# Patient Record
Sex: Female | Born: 1995
Health system: Southern US, Community
[De-identification: ages and names within clinical notes are randomized; demographics above are authoritative.]

## PROBLEM LIST (undated history)

## (undated) DIAGNOSIS — L309 Dermatitis, unspecified: Secondary | ICD-10-CM

## (undated) DIAGNOSIS — L509 Urticaria, unspecified: Secondary | ICD-10-CM

## (undated) HISTORY — DX: Dermatitis, unspecified: L30.9

## (undated) HISTORY — DX: Urticaria, unspecified: L50.9

---

## 2019-02-14 ENCOUNTER — Emergency Department (HOSPITAL_COMMUNITY): Payer: Self-pay

## 2019-02-14 ENCOUNTER — Encounter (HOSPITAL_COMMUNITY): Payer: Self-pay | Admitting: Emergency Medicine

## 2019-02-14 ENCOUNTER — Other Ambulatory Visit: Payer: Self-pay

## 2019-02-14 ENCOUNTER — Emergency Department (HOSPITAL_COMMUNITY)
Admission: EM | Admit: 2019-02-14 | Discharge: 2019-02-15 | Disposition: A | Discharge status: Home / Self Care | Payer: Self-pay | Attending: Emergency Medicine | Admitting: Emergency Medicine

## 2019-02-14 DIAGNOSIS — S0990XA Unspecified injury of head, initial encounter: Secondary | ICD-10-CM | POA: Insufficient documentation

## 2019-02-14 DIAGNOSIS — M5412 Radiculopathy, cervical region: Secondary | ICD-10-CM | POA: Insufficient documentation

## 2019-02-14 DIAGNOSIS — X58XXXA Exposure to other specified factors, initial encounter: Secondary | ICD-10-CM | POA: Insufficient documentation

## 2019-02-14 DIAGNOSIS — S161XXA Strain of muscle, fascia and tendon at neck level, initial encounter: Secondary | ICD-10-CM | POA: Insufficient documentation

## 2019-02-14 DIAGNOSIS — R109 Unspecified abdominal pain: Secondary | ICD-10-CM

## 2019-02-14 DIAGNOSIS — Y9319 Activity, other involving water and watercraft: Secondary | ICD-10-CM | POA: Insufficient documentation

## 2019-02-14 DIAGNOSIS — Y999 Unspecified external cause status: Secondary | ICD-10-CM | POA: Insufficient documentation

## 2019-02-14 DIAGNOSIS — R1011 Right upper quadrant pain: Secondary | ICD-10-CM | POA: Insufficient documentation

## 2019-02-14 DIAGNOSIS — Y929 Unspecified place or not applicable: Secondary | ICD-10-CM | POA: Insufficient documentation

## 2019-02-14 DIAGNOSIS — M25511 Pain in right shoulder: Secondary | ICD-10-CM | POA: Insufficient documentation

## 2019-02-14 LAB — COMPREHENSIVE METABOLIC PANEL
ALT: 28 U/L (ref 0–44)
AST: 25 U/L (ref 15–41)
Albumin: 4.1 g/dL (ref 3.5–5.0)
Alkaline Phosphatase: 61 U/L (ref 38–126)
Anion gap: 12 (ref 5–15)
BUN: 9 mg/dL (ref 6–20)
CO2: 22 mmol/L (ref 22–32)
Calcium: 9.2 mg/dL (ref 8.9–10.3)
Chloride: 106 mmol/L (ref 98–111)
Creatinine, Ser: 0.85 mg/dL (ref 0.44–1.00)
GFR calc Af Amer: >60 mL/min (ref >60)
GFR calc non Af Amer: >60 mL/min (ref >60)
Glucose, Bld: 90 mg/dL (ref 70–99)
Potassium: 3.6 mmol/L (ref 3.5–5.1)
Sodium: 140 mmol/L (ref 135–145)
Total Bilirubin: 0.6 mg/dL (ref 0.3–1.2)
Total Protein: 7 g/dL (ref 6.5–8.1)

## 2019-02-14 LAB — CBC WITH DIFFERENTIAL/PLATELET
Abs Immature Granulocytes: 0.05 10*3/uL (ref 0.00–0.07)
Basophils Absolute: 0.1 10*3/uL (ref 0.0–0.1)
Basophils Relative: 1 %
Eosinophils Absolute: 0.1 10*3/uL (ref 0.0–0.5)
Eosinophils Relative: 1 %
HCT: 42.4 % (ref 36.0–46.0)
Hemoglobin: 12.9 g/dL (ref 12.0–15.0)
Immature Granulocytes: 0 %
Lymphocytes Relative: 25 %
Lymphs Abs: 3.7 10*3/uL (ref 0.7–4.0)
MCH: 25.1 pg — ABNORMAL LOW (ref 26.0–34.0)
MCHC: 30.4 g/dL (ref 30.0–36.0)
MCV: 82.7 fL (ref 80.0–100.0)
Monocytes Absolute: 0.8 10*3/uL (ref 0.1–1.0)
Monocytes Relative: 5 %
Neutro Abs: 10.3 10*3/uL — ABNORMAL HIGH (ref 1.7–7.7)
Neutrophils Relative %: 68 %
Platelets: 459 10*3/uL — ABNORMAL HIGH (ref 150–400)
RBC: 5.13 MIL/uL — ABNORMAL HIGH (ref 3.87–5.11)
RDW: 13.6 % (ref 11.5–15.5)
WBC: 15.1 10*3/uL — ABNORMAL HIGH (ref 4.0–10.5)
nRBC: 0 % (ref 0.0–0.2)

## 2019-02-14 LAB — I-STAT BETA HCG BLOOD, ED (MC, WL, AP ONLY): I-stat hCG, quantitative: <5 m[IU]/mL (ref <5)

## 2019-02-14 LAB — ETHANOL: Alcohol, Ethyl (B): <10 mg/dL (ref <10)

## 2019-02-14 MED ORDER — FENTANYL CITRATE (PF) 100 MCG/2ML IJ SOLN
50.0000 ug | INTRAMUSCULAR | Status: DC | PRN
  Administered 2019-02-14: 23:00:00 50 ug via INTRAVENOUS
  Filled 2019-02-14 (×2): qty 2

## 2019-02-14 MED ORDER — SODIUM CHLORIDE 0.9 % IV BOLUS
500.0000 mL | Freq: Once | INTRAVENOUS | Status: AC
  Administered 2019-02-14: 21:00:00 500 mL via INTRAVENOUS

## 2019-02-14 MED ORDER — IOHEXOL 300 MG/ML  SOLN
100.0000 mL | Freq: Once | INTRAMUSCULAR | Status: AC | PRN
  Administered 2019-02-14: 100 mL via INTRAVENOUS
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
## 2019-02-14 NOTE — ED Notes (Signed)
Please call mom [phone]
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
## 2019-02-15 ENCOUNTER — Emergency Department (HOSPITAL_COMMUNITY): Payer: Self-pay

## 2019-02-15 NOTE — Discharge Instructions (Signed)
You may alternate Tylenol 1000 mg every 6 hours as needed for pain and Ibuprofen 800 mg every 8 hours as needed for pain.  Please take Ibuprofen with food.  Your imaging today was normal.  Steps to find a Primary Care Provider (PCP):  Call [phone] or [phone] to access "[facility]."  2.  You may also go on the [facility] website at [email or web address]  3.  [facility] also frequently accepts new patients.  [place] [phone]  4.  There are also multiple [facility], [facility] and [facility] practices throughout the [place] that are frequently accepting new patients. You may find a clinic that is close to your home and contact them.  [facility] [email or web address] [phone]  [facility] [email or web address]  [email or web address] [place] [email or web address] [phone]  5.  Local Health Departments also can provide primary care services.  [facility]  [place] [address] [phone]  [facility] [place] [address] [place] [facility] [place] [phone]

## 2019-02-15 NOTE — ED Notes (Signed)
Patient transported to MRI 

## 2020-02-16 ENCOUNTER — Other Ambulatory Visit: Payer: Self-pay

## 2020-02-16 ENCOUNTER — Emergency Department (INDEPENDENT_AMBULATORY_CARE_PROVIDER_SITE_OTHER): Payer: No Typology Code available for payment source

## 2020-02-16 ENCOUNTER — Emergency Department
Admission: EM | Admit: 2020-02-16 | Discharge: 2020-02-16 | Disposition: A | Discharge status: Home / Self Care | Payer: Self-pay | Source: Home / Self Care

## 2020-02-16 DIAGNOSIS — W1843XA Slipping, tripping and stumbling without falling due to stepping from one level to another, initial encounter: Secondary | ICD-10-CM | POA: Diagnosis not present

## 2020-02-16 DIAGNOSIS — S93402A Sprain of unspecified ligament of left ankle, initial encounter: Secondary | ICD-10-CM

## 2020-02-16 NOTE — Discharge Instructions (Signed)
  You may take 500mg acetaminophen every 4-6 hours or in combination with ibuprofen 400-600mg every 6-8 hours as needed for pain and inflammation.   Call to schedule a follow up appointment with Sports Medicine in 1-2 weeks if not improving.  
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
## 2020-07-09 ENCOUNTER — Other Ambulatory Visit: Payer: Self-pay

## 2020-07-09 ENCOUNTER — Ambulatory Visit (INDEPENDENT_AMBULATORY_CARE_PROVIDER_SITE_OTHER): Payer: 59

## 2020-07-09 DIAGNOSIS — Z23 Encounter for immunization: Secondary | ICD-10-CM

## 2020-07-29 DIAGNOSIS — F411 Generalized anxiety disorder: Secondary | ICD-10-CM | POA: Diagnosis not present

## 2020-07-31 IMAGING — MR MRI THORACIC SPINE WITHOUT CONTRAST
5 of 6 series · 24 of 48 positions shown · non-contrast
Comparison: None.

CLINICAL DATA: Trauma with right arm numbness and weakness.

EXAM:
MRI THORACIC SPINE WITHOUT CONTRAST
TECHNIQUE: Multiplanar, multisequence MR imaging of the thoracic spine was
performed. No intravenous contrast was administered.

[Series 14: T1 · sagittal · 6.0mm · 1.23mm/px · 2 of 9 slices shown (1 of 2)]
[im 1/9]
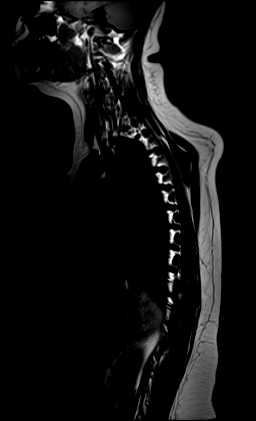
[im 9/9]
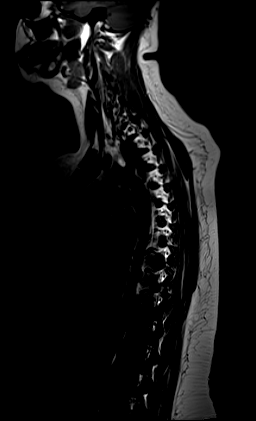

[Series 15: T2 · sagittal · 3.0mm · 0.76mm/px · 6 of 17 slices shown (1 of 2)]
[im 1/17]
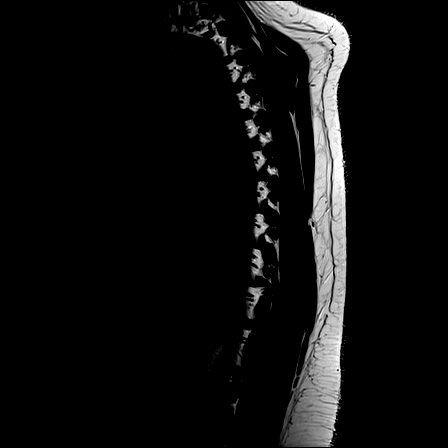
[im 4/17]
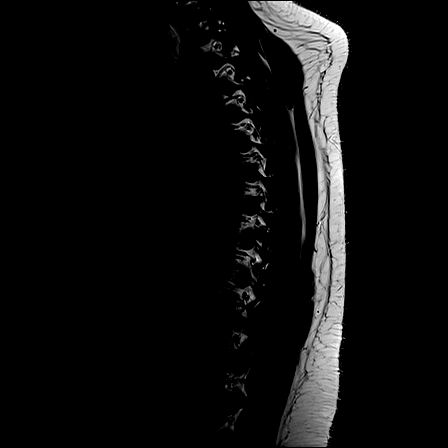
[im 7/17]
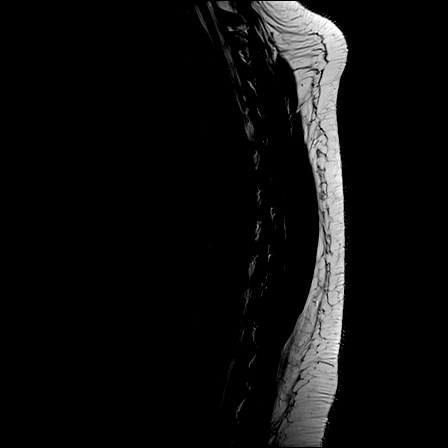
[im 10/17]
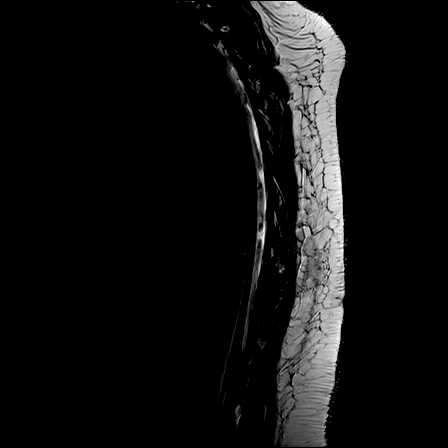
[im 13/17]
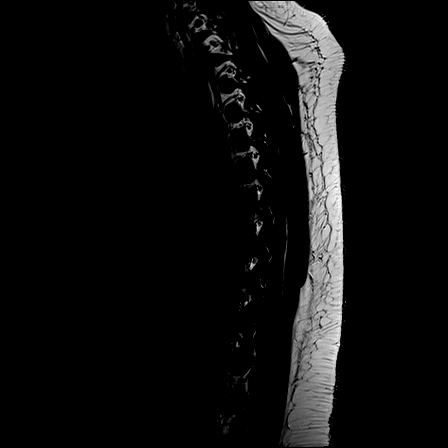
[im 17/17]
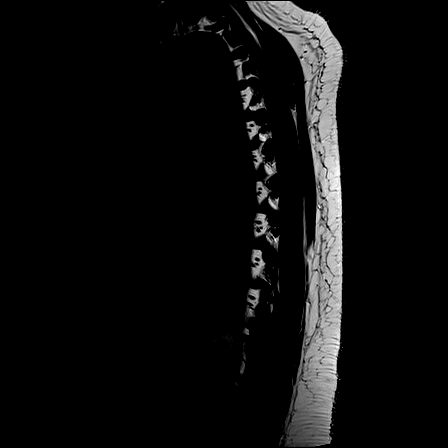

[Series 16: T1 · sagittal · 3.0mm · 0.76mm/px · 6 of 17 slices shown (2 of 2)]
[im 1/17]
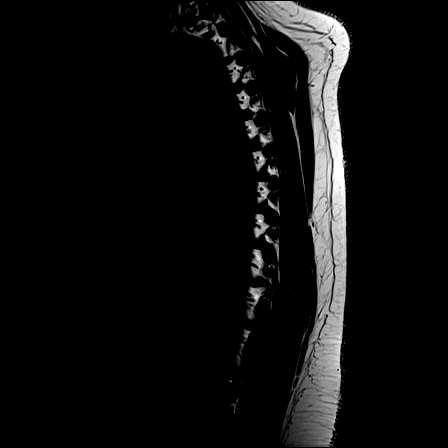
[im 4/17]
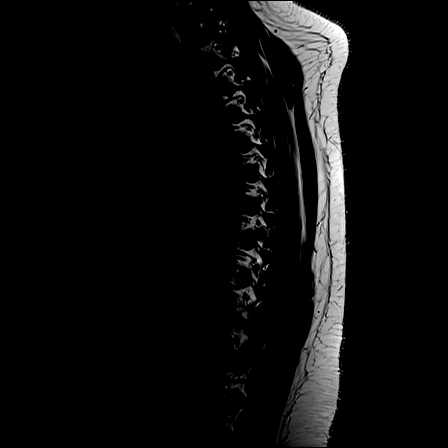
[im 7/17]
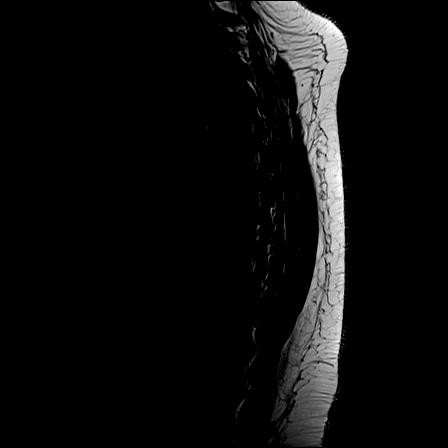
[im 10/17]
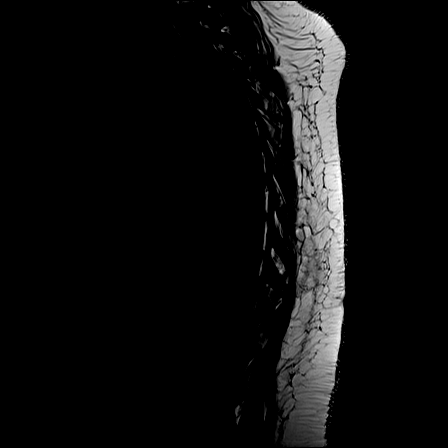
[im 13/17]
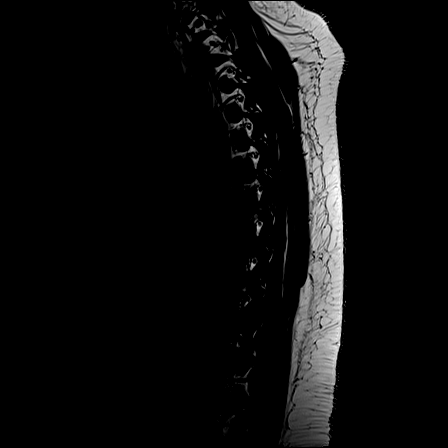
[im 17/17]
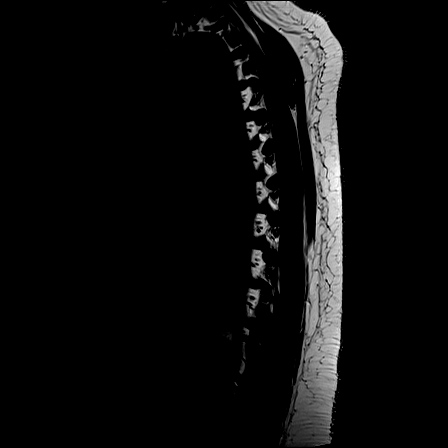

[Series 17: STIR · sagittal · 3.0mm · 0.38mm/px · 2 of 17 slices shown]
[im 1/17]
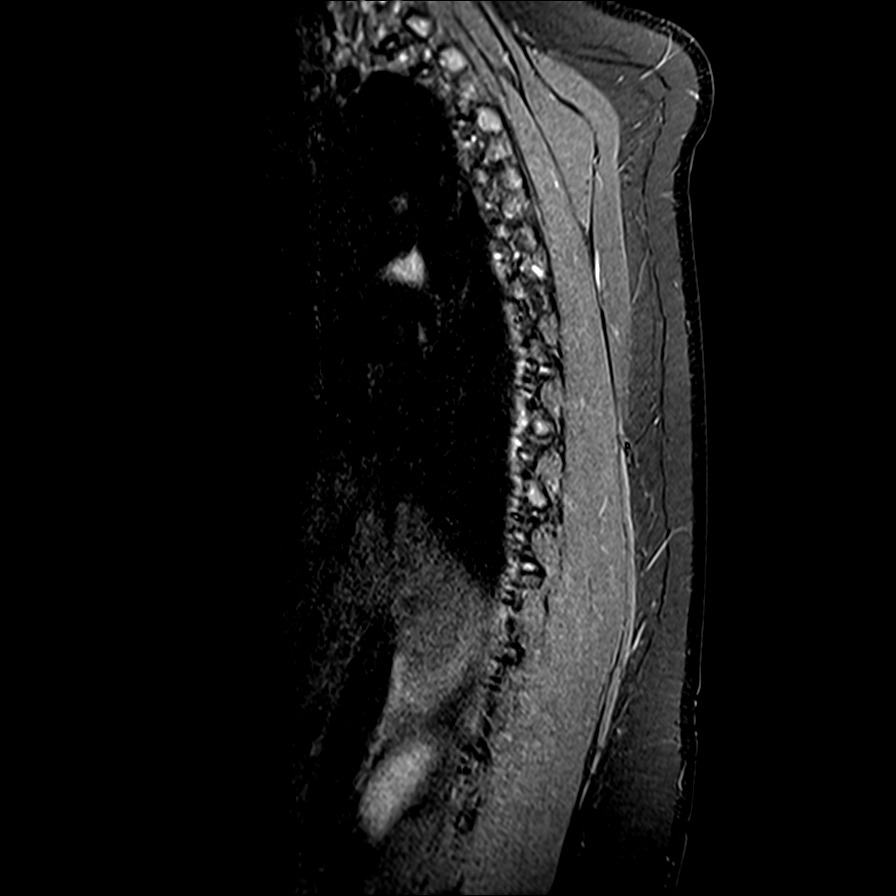
[im 4/17]
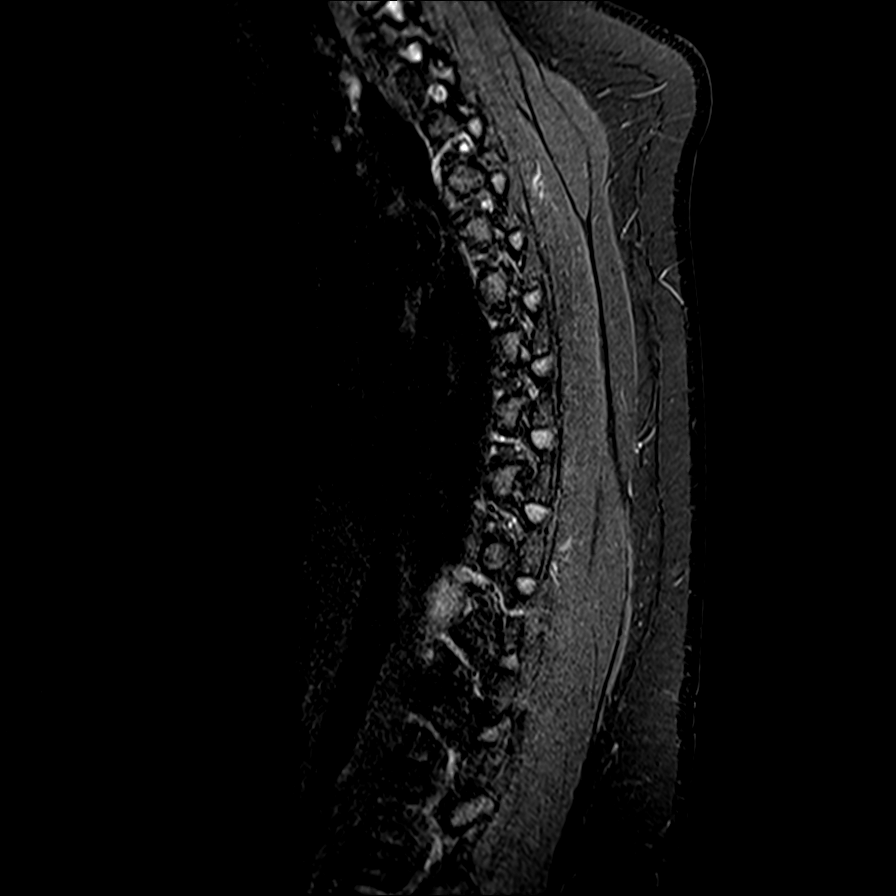

[Series 18: T2 · axial · 4.0mm · 0.59mm/px · z∈[-329,-89]mm · 8 of 39 slices shown (2 of 2)]
[im 1/39]
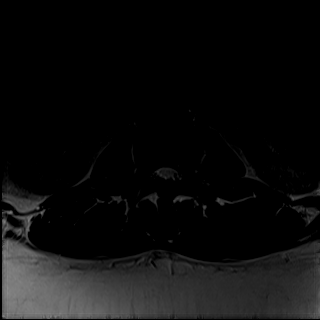
[im 6/39]
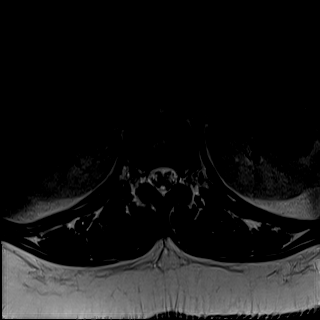
[im 12/39]
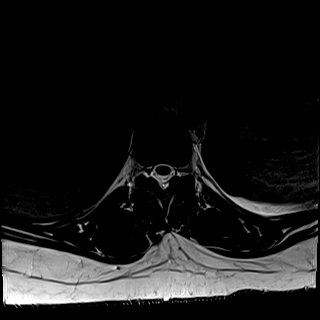
[im 18/39]
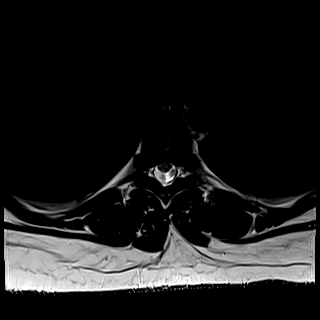
[im 21/39]
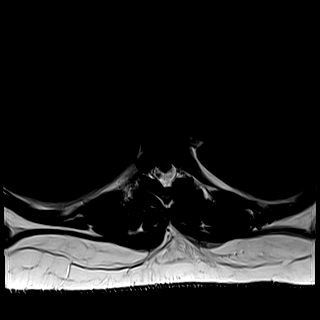
[im 27/39]
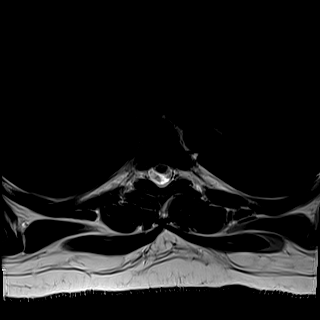
[im 33/39]
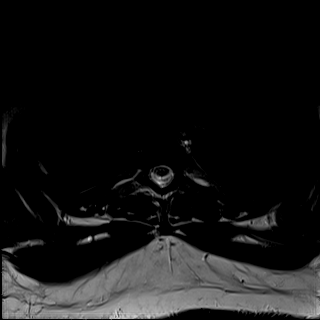
[im 39/39]
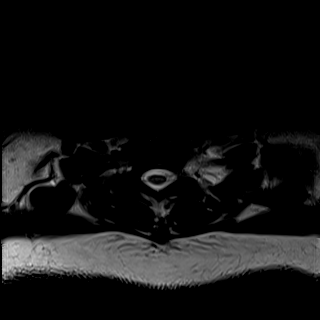

[24 of 48 positions shown; findings below may reference images not displayed]

FINDINGS: Alignment:  Normal

Vertebrae: Normal

Cord:  Normal

Paraspinal and other soft tissues: Unremarkable

Disc levels:

No disc herniation or stenosis.
IMPRESSION: Normal thoracic spine MRI.

## 2020-08-16 ENCOUNTER — Other Ambulatory Visit (HOSPITAL_COMMUNITY): Payer: Self-pay | Admitting: Nurse Practitioner

## 2020-08-16 MED FILL — CITALOPRAM HBR 20 MG TABLET: 20 | 90 days supply | Qty: 90 | Fill #0

## 2020-08-16 MED FILL — PANTOPRAZOLE SOD DR 40 MG T: 40 | 30 days supply | Qty: 30 | Fill #0

## 2020-09-01 ENCOUNTER — Telehealth: Payer: 59 | Admitting: Nurse Practitioner

## 2020-09-01 DIAGNOSIS — J0100 Acute maxillary sinusitis, unspecified: Secondary | ICD-10-CM

## 2020-09-01 MED ORDER — DOXYCYCLINE HYCLATE 100 MG PO TABS: 100.0000 mg | ORAL_TABLET | Freq: Two times a day (BID) | ORAL | 0 refills | Status: DC

## 2020-09-01 NOTE — Progress Notes (Signed)

## 2020-09-23 DIAGNOSIS — J329 Chronic sinusitis, unspecified: Secondary | ICD-10-CM | POA: Diagnosis not present

## 2020-09-23 DIAGNOSIS — B9689 Other specified bacterial agents as the cause of diseases classified elsewhere: Secondary | ICD-10-CM | POA: Diagnosis not present

## 2020-09-27 MED FILL — PANTOPRAZOLE SOD DR 40 MG T: 40 | 30 days supply | Qty: 30 | Fill #1

## 2020-10-12 ENCOUNTER — Other Ambulatory Visit (HOSPITAL_BASED_OUTPATIENT_CLINIC_OR_DEPARTMENT_OTHER): Payer: Self-pay

## 2020-10-14 DIAGNOSIS — H5213 Myopia, bilateral: Secondary | ICD-10-CM | POA: Diagnosis not present

## 2020-10-14 DIAGNOSIS — H52223 Regular astigmatism, bilateral: Secondary | ICD-10-CM | POA: Diagnosis not present

## 2020-11-01 MED FILL — Pantoprazole Sodium EC Tab 40 MG (Base Equiv): ORAL | 30 days supply | Qty: 30 | Fill #0 | Status: AC

## 2020-11-02 ENCOUNTER — Other Ambulatory Visit (HOSPITAL_COMMUNITY): Payer: Self-pay

## 2020-11-02 DIAGNOSIS — Z6841 Body Mass Index (BMI) 40.0 and over, adult: Secondary | ICD-10-CM | POA: Diagnosis not present

## 2020-11-02 DIAGNOSIS — Z113 Encounter for screening for infections with a predominantly sexual mode of transmission: Secondary | ICD-10-CM | POA: Diagnosis not present

## 2020-11-02 DIAGNOSIS — Z124 Encounter for screening for malignant neoplasm of cervix: Secondary | ICD-10-CM | POA: Diagnosis not present

## 2020-11-02 DIAGNOSIS — Z01419 Encounter for gynecological examination (general) (routine) without abnormal findings: Secondary | ICD-10-CM | POA: Diagnosis not present

## 2020-12-06 ENCOUNTER — Other Ambulatory Visit (HOSPITAL_COMMUNITY): Payer: Self-pay

## 2020-12-06 MED FILL — Citalopram Hydrobromide Tab 20 MG (Base Equiv): ORAL | 90 days supply | Qty: 90 | Fill #0 | Status: AC

## 2020-12-06 MED FILL — Pantoprazole Sodium EC Tab 40 MG (Base Equiv): ORAL | 30 days supply | Qty: 30 | Fill #1 | Status: AC

## 2020-12-08 NOTE — Progress Notes (Deleted)
New Patient Note  RE: Hailey Cruz MRN: [id number] DOB: January 22, 1996 Date of Office Visit: 12/09/2020  Consult requested by: No ref. provider found Primary care provider: System, Provider Not In  Chief Complaint: No chief complaint on file.  History of Present Illness: I had the pleasure of seeing Hailey Cruz for initial evaluation at the [facility] on 12/08/2020. She is a 25 y.o. female, who is referred here by System, Provider Not In for the evaluation of ***.  ***  Assessment and Plan: Hailey Cruz is a 25 y.o. female with: No problem-specific Assessment & Plan notes found for this encounter.  No follow-ups on file.  No orders of the defined types were placed in this encounter.  Lab Orders  No laboratory test(s) ordered today    Other allergy screening: Asthma: {Blank single:19197::"yes","no"} Rhino conjunctivitis: {Blank single:19197::"yes","no"} Food allergy: {Blank single:19197::"yes","no"} Medication allergy: {Blank single:19197::"yes","no"} Hymenoptera allergy: {Blank single:19197::"yes","no"} Urticaria: {Blank single:19197::"yes","no"} Eczema:{Blank single:19197::"yes","no"} History of recurrent infections suggestive of immunodeficency: {Blank single:19197::"yes","no"}  Diagnostics: Spirometry:  Tracings reviewed. Her effort: {Blank single:19197::"Good reproducible efforts.","It was hard to get consistent efforts and there is a question as to whether this reflects a maximal maneuver.","Poor effort, data can not be interpreted."} FVC: ***L FEV1: ***L, ***% predicted FEV1/FVC ratio: ***% Interpretation: {Blank single:19197::"Spirometry consistent with mild obstructive disease","Spirometry consistent with moderate obstructive disease","Spirometry consistent with severe obstructive disease","Spirometry consistent with possible restrictive disease","Spirometry consistent with mixed obstructive and restrictive disease","Spirometry uninterpretable due  to technique","Spirometry consistent with normal pattern","No overt abnormalities noted given today's efforts"}.  Please see scanned spirometry results for details.  Skin Testing: {Blank single:19197::"Select foods","Environmental allergy panel","Environmental allergy panel and select foods","Food allergy panel","None","Deferred due to recent antihistamines use"}. Positive test to: ***. Negative test to: ***.  Results discussed with patient/family.   Past Medical History: There are no problems to display for this patient.  No past medical history on file. Past Surgical History: No past surgical history on file. Medication List:  Current Outpatient Medications  Medication Sig Dispense Refill  . citalopram (CELEXA) 20 MG tablet TAKE 1 TABLET (20 MG TOTAL) BY MOUTH DAILY. 90 tablet 1  . doxycycline (VIBRA-TABS) 100 MG tablet Take 1 tablet (100 mg total) by mouth 2 (two) times daily. 1 po bid 20 tablet 0  . escitalopram (LEXAPRO) 10 MG tablet Take 10 mg by mouth at bedtime.     . pantoprazole (PROTONIX) 40 MG tablet Take 40 mg by mouth at bedtime.     . pantoprazole (PROTONIX) 40 MG tablet TAKE 1 TABLET BY MOUTH DAILY 30 tablet 5  . sertraline (ZOLOFT) 100 MG tablet Take 100 mg by mouth daily.     No current facility-administered medications for this visit.   Allergies: Allergies  Allergen Reactions  . Penicillins Hives and Rash   Social History: Social History   Socioeconomic History  . Marital status: Single    Spouse name: Not on file  . Number of children: Not on file  . Years of education: Not on file  . Highest education level: Not on file  Occupational History  . Not on file  Tobacco Use  . Smoking status: Never Smoker  . Smokeless tobacco: Never Used  Substance and Sexual Activity  . Alcohol use: Yes    Comment: occasionally   . Drug use: Not on file  . Sexual activity: Not on file  Other Topics Concern  . Not on file  Social History Narrative  . Not on file    Social  Determinants of Health   Financial Resource Strain: Not on file  Food Insecurity: Not on file  Transportation Needs: Not on file  Physical Activity: Not on file  Stress: Not on file  Social Connections: Not on file   Lives in a ***. Smoking: *** Occupation: ***  Environmental HistorySurveyor, minerals in the house: Copywriter, advertising in the family room: {Blank single:19197::"yes","no"} Carpet in the bedroom: {Blank single:19197::"yes","no"} Heating: {Blank single:19197::"electric","gas","heat pump"} Cooling: {Blank single:19197::"central","window","heat pump"} Pet: {Blank single:19197::"yes ***","no"}  Family History: Family History  Problem Relation Age of Onset  . Diabetes Mother   . Hypertension Mother   . Diabetes Father    Problem                               Relation Asthma                                   *** Eczema                                *** Food allergy                          *** Allergic rhino conjunctivitis     ***  Review of Systems  Constitutional: Negative for appetite change, chills, fever and unexpected weight change.  HENT: Negative for congestion and rhinorrhea.   Eyes: Negative for itching.  Respiratory: Negative for cough, chest tightness, shortness of breath and wheezing.   Cardiovascular: Negative for chest pain.  Gastrointestinal: Negative for abdominal pain.  Genitourinary: Negative for difficulty urinating.  Skin: Negative for rash.  Neurological: Negative for headaches.   Objective: There were no vitals taken for this visit. There is no height or weight on file to calculate BMI. Physical Exam Vitals and nursing note reviewed.  Constitutional:      Appearance: Normal appearance. She is well-developed.  HENT:     Head: Normocephalic and atraumatic.     Right Ear: External ear normal.     Left Ear: External ear normal.     Nose: Nose normal.     Mouth/Throat:     Mouth: Mucous membranes are  moist.     Pharynx: Oropharynx is clear.  Eyes:     Conjunctiva/sclera: Conjunctivae normal.  Cardiovascular:     Rate and Rhythm: Normal rate and regular rhythm.     Heart sounds: Normal heart sounds. No murmur heard. No friction rub. No gallop.   Pulmonary:     Effort: Pulmonary effort is normal.     Breath sounds: Normal breath sounds. No wheezing, rhonchi or rales.  Abdominal:     Palpations: Abdomen is soft.  Musculoskeletal:     Cervical back: Neck supple.  Skin:    General: Skin is warm.     Findings: No rash.  Neurological:     Mental Status: She is alert and oriented to person, place, and time.  Psychiatric:        Behavior: Behavior normal.    The plan was reviewed with the patient/family, and all questions/concerned were addressed.  It was my pleasure to see Hailey Cruz today and participate in her care. Please feel free to contact me with any questions or concerns.  Sincerely,  Wyline Mood, DO Allergy &  Immunology  [facility] office: [place] office: [phone]

## 2020-12-09 ENCOUNTER — Ambulatory Visit: Payer: 59 | Admitting: Allergy

## 2020-12-23 DIAGNOSIS — R5383 Other fatigue: Secondary | ICD-10-CM | POA: Diagnosis not present

## 2020-12-23 DIAGNOSIS — R400 Somnolence: Secondary | ICD-10-CM | POA: Diagnosis not present

## 2020-12-23 DIAGNOSIS — R197 Diarrhea, unspecified: Secondary | ICD-10-CM | POA: Diagnosis not present

## 2020-12-23 DIAGNOSIS — M79604 Pain in right leg: Secondary | ICD-10-CM | POA: Diagnosis not present

## 2020-12-23 DIAGNOSIS — G4700 Insomnia, unspecified: Secondary | ICD-10-CM | POA: Diagnosis not present

## 2020-12-23 DIAGNOSIS — M79605 Pain in left leg: Secondary | ICD-10-CM | POA: Diagnosis not present

## 2020-12-27 DIAGNOSIS — R5383 Other fatigue: Secondary | ICD-10-CM | POA: Diagnosis not present

## 2021-01-10 ENCOUNTER — Other Ambulatory Visit (HOSPITAL_COMMUNITY): Payer: Self-pay

## 2021-01-10 MED FILL — Pantoprazole Sodium EC Tab 40 MG (Base Equiv): ORAL | 30 days supply | Qty: 30 | Fill #2 | Status: AC

## 2021-01-27 ENCOUNTER — Other Ambulatory Visit: Payer: Self-pay

## 2021-01-27 ENCOUNTER — Other Ambulatory Visit (HOSPITAL_COMMUNITY): Payer: Self-pay

## 2021-01-27 ENCOUNTER — Telehealth: Payer: Self-pay

## 2021-01-27 ENCOUNTER — Ambulatory Visit: Payer: 59 | Admitting: Allergy

## 2021-01-27 ENCOUNTER — Encounter: Payer: Self-pay | Admitting: Allergy

## 2021-01-27 VITALS — BP 100/58 | HR 61 | Temp 97.6°F | Resp 18 | Ht 65.5 in | Wt 276.5 lb

## 2021-01-27 DIAGNOSIS — J329 Chronic sinusitis, unspecified: Secondary | ICD-10-CM | POA: Insufficient documentation

## 2021-01-27 DIAGNOSIS — J3089 Other allergic rhinitis: Secondary | ICD-10-CM

## 2021-01-27 DIAGNOSIS — T50995D Adverse effect of other drugs, medicaments and biological substances, subsequent encounter: Secondary | ICD-10-CM

## 2021-01-27 MED ORDER — AZELASTINE-FLUTICASONE 137-50 MCG/ACT NA SUSP
1.0000 | Freq: Two times a day (BID) | NASAL | 5 refills | Status: DC
  Filled 2021-01-27: qty 23, 30d supply, fill #0

## 2021-01-27 NOTE — Progress Notes (Signed)
New Patient Note  RE: Hailey Cruz MRN: [id number] DOB: 1996-04-01 Date of Office Visit: 01/27/2021  Consult requested by: No ref. provider found Primary care provider: [place], Julie M, NP  Chief Complaint: Allergies (Environmental, has had several sinus infections in the last couple of months)  History of Present Illness: I had the pleasure of seeing Hailey Cruz for initial evaluation at the [facility] on 01/27/2021. She is a 25 y.o. female, who is self-referred here for the evaluation of allergic rhinitis.  She reports symptoms of sinus infections, itchy skin, nasal congestion, rhinorrhea, PND, sneezing, itch/watery eyes. Symptoms have been going on for 10 years. The symptoms are present all year around with worsening in spring. Other triggers include exposure to none. Anosmia: no. Headache: yes. She has used Claritin QHS and zyrtec QAM, OTC eye drops with some improvement in symptoms. Flonase did not help in the past. Sinus infections: 2-3 within the past 4 months and needed antibiotics with good benefit. Previous work up includes: none. Previous ENT evaluation: no. Previous sinus imaging: no. History of nasal polyps: no. Last eye exam: 2 months ago. History of reflux: sometimes and takes Protonix daily.  Assessment and Plan: Hailey Cruz is a 25 y.o. female with: Other allergic rhinitis Perennial rhinitis symptoms for 10 years with worsening in the spring.  Tried Claritin and Zyrtec with some benefit.  Concerned about frequent sinusitis requiring antibiotics the last 4 months.  No prior allergy/ENT evaluation. Today's skin testing showed: Borderline positive to ragweed, mold, dog. Start environmental control measures as below. Use over the counter antihistamines such as Zyrtec (cetirizine), Claritin (loratadine), Allegra (fexofenadine), or Xyzal (levocetirizine) daily as needed. May take twice a day during allergy flares. May switch antihistamines every few  months. Start dymista (fluticasone + azelastine nasal spray combination) 1 spray per nostril twice a day.This replaces Flonase (fluticasone) for now. If it's not covered let us know.   Frequent episodes of sinusitis Frequent sinus infections the last 4 months.  Today's skin testing showed: Borderline positive to ragweed, mold, dog. Discussed with patient that given above test results, I recommend ENT referral next for the frequent sinusitis to rule out any anatomical abnormalities.  Keep track of infections/antibiotics use. If no improvement, will get basic immune evaluation next.   Adverse effect of other drugs, medicaments and biological substances, subsequent encounter Broke out in rash as a child after taking penicillin type antibiotics. Continue to avoid. Consider penicillin skin testing and in office drug challenge in the future.   Return in about 3 months (around 04/29/2021).  Meds ordered this encounter  Medications   Azelastine-Fluticasone 137-50 MCG/ACT SUSP    Sig: Place 1 spray into the nose in the morning and at bedtime.    Dispense:  23 g    Refill:  5    Lab Orders  No laboratory test(s) ordered today    Other allergy screening: Asthma: no Food allergy: no Medication allergy: yes Penicillin - rash as a child. Hymenoptera allergy: no Urticaria: one episode - broke out with no known triggers. Eczema:yes History of recurrent infections suggestive of immunodeficency: no  Diagnostics: Skin Testing: Environmental allergy panel. Borderline positive to ragweed, mold, dog. Results discussed with patient/family.  Airborne Adult Perc - 01/27/21 1456     Time Antigen Placed 1456    Allergen Manufacturer Waynette Buttery    Location Back    Number of Test 59    Panel 1 Select    1. Control-Buffer 50% Glycerol Negative  2. Control-Histamine 1 mg/ml 2+    3. Albumin saline Negative    4. Bahia Negative    5. French Southern Territories Negative    6. Johnson Negative    7. Kentucky Blue  Negative    8. Meadow Fescue Negative    9. Perennial Rye Negative    10. Sweet Vernal Negative    11. Timothy Negative    12. Cocklebur Negative    13. Burweed Marshelder Negative    14. Ragweed, short Negative    15. Ragweed, Giant Negative    16. Plantain,  English Negative    17. Lamb's Quarters Negative    18. Sheep Sorrell Negative    19. Rough Pigweed Negative    20. Marsh Elder, Rough Negative    21. Mugwort, Common Negative    22. Ash mix Negative    23. Birch mix Negative    24. Beech American Negative    25. Box, Elder Negative    26. Cedar, red Negative    27. Cottonwood, Guinea-Bissau Negative    28. Elm mix Negative    29. Hickory Negative    30. Maple mix Negative    31. Oak, Guinea-Bissau mix Negative    32. Pecan Pollen Negative    33. Pine mix Negative    34. Sycamore Eastern Negative    35. Walnut, Black Pollen Negative    36. Alternaria alternata Negative    37. Cladosporium Herbarum Negative    38. Aspergillus mix Negative    39. Penicillium mix Negative    40. Bipolaris sorokiniana (Helminthosporium) Negative    41. Drechslera spicifera (Curvularia) Negative    42. Mucor plumbeus Negative    43. Fusarium moniliforme Negative    44. Aureobasidium pullulans (pullulara) Negative    45. Rhizopus oryzae Negative    46. Botrytis cinera Negative    47. Epicoccum nigrum Negative    48. Phoma betae Negative    49. Candida Albicans Negative    50. Trichophyton mentagrophytes Negative    51. Mite, D Farinae  5,000 AU/ml Negative    52. Mite, D Pteronyssinus  5,000 AU/ml Negative    53. Cat Hair 10,000 BAU/ml Negative    54.  Dog Epithelia Negative    55. Mixed Feathers Negative    56. Horse Epithelia Negative    57. Cockroach, German Negative    58. Mouse Negative    59. Tobacco Leaf Negative             Intradermal - 01/27/21 1556     Time Antigen Placed 1556    Allergen Manufacturer Waynette Buttery    Location Back    Number of Test 15    Intradermal Select     Control Negative    French Southern Territories Negative    Johnson Negative    7 Grass Negative    Ragweed mix --   +/-   Weed mix Negative    Tree mix Negative    Mold 1 Negative    Mold 2 --   +/-   Mold 3 Negative    Mold 4 Negative    Cat Negative    Dog --   +/-   Cockroach Negative    Mite mix Negative             Past Medical History: Patient Active Problem List   Diagnosis Date Noted   Other allergic rhinitis 01/27/2021   Frequent episodes of sinusitis 01/27/2021   Adverse effect of other drugs, medicaments and  biological substances, subsequent encounter 01/27/2021    Past Medical History:  Diagnosis Date   Eczema    Urticaria    Past Surgical History: History reviewed. No pertinent surgical history. Medication List:  Current Outpatient Medications  Medication Sig Dispense Refill   Azelastine-Fluticasone 137-50 MCG/ACT SUSP Place 1 spray into the nose in the morning and at bedtime. 23 g 5   cetirizine (ZYRTEC) 10 MG tablet Take 10 mg by mouth at bedtime.     citalopram (CELEXA) 20 MG tablet TAKE 1 TABLET (20 MG TOTAL) BY MOUTH DAILY. 90 tablet 1   liraglutide (VICTOZA) 18 MG/3ML SOPN Inject into the skin.     loratadine (CLARITIN) 10 MG tablet Take 10 mg by mouth daily.     pantoprazole (PROTONIX) 40 MG tablet TAKE 1 TABLET BY MOUTH DAILY 30 tablet 5   doxycycline (VIBRA-TABS) 100 MG tablet Take 1 tablet (100 mg total) by mouth 2 (two) times daily. 1 po bid 20 tablet 0   escitalopram (LEXAPRO) 10 MG tablet Take 10 mg by mouth at bedtime.      pantoprazole (PROTONIX) 40 MG tablet Take 40 mg by mouth at bedtime.      sertraline (ZOLOFT) 100 MG tablet Take 100 mg by mouth daily.     No current facility-administered medications for this visit.   Allergies: Allergies  Allergen Reactions   Penicillins Hives and Rash   Social History: Social History   Socioeconomic History   Marital status: Single    Spouse name: Not on file   Number of children: Not on file   Years of  education: Not on file   Highest education level: Not on file  Occupational History   Not on file  Tobacco Use   Smoking status: Never   Smokeless tobacco: Never  Vaping Use   Vaping Use: Never used  Substance and Sexual Activity   Alcohol use: Yes    Comment: occasionally    Drug use: Never   Sexual activity: Not on file  Other Topics Concern   Not on file  Social History Narrative   Not on file   Social Determinants of Health   Financial Resource Strain: Not on file  Food Insecurity: Not on file  Transportation Needs: Not on file  Physical Activity: Not on file  Stress: Not on file  Social Connections: Not on file   Lives in an apartment. Smoking: denies Occupation: Nutritional therapistMA  Environmental HistorySurveyor, minerals: Water Damage/mildew in the house: no Engineer, civil (consulting)Carpet in the family room: no Carpet in the bedroom: yes Heating: electric Cooling: central Pet: yes 3 dogs x many years  Family History: Family History  Problem Relation Age of Onset   Eczema Mother    Allergic rhinitis Mother    Diabetes Mother    Hypertension Mother    Eczema Father    Allergic rhinitis Father    Diabetes Father    Eczema Brother    Allergic rhinitis Brother    Asthma Neg Hx    Urticaria Neg Hx    Review of Systems  Constitutional:  Negative for appetite change, chills, fever and unexpected weight change.  HENT:  Positive for congestion, postnasal drip and rhinorrhea.   Eyes:  Positive for itching.  Respiratory:  Negative for cough, chest tightness, shortness of breath and wheezing.   Cardiovascular:  Negative for chest pain.  Gastrointestinal:  Negative for abdominal pain.  Genitourinary:  Negative for difficulty urinating.  Skin:  Negative for rash.  Neurological:  Positive for  headaches.   Objective: BP (!) 100/58 (BP Location: Right Arm, Patient Position: Sitting, Cuff Size: Large)   Pulse 61   Temp 97.6 F (36.4 C) (Temporal)   Resp 18   Ht 5' 5.5" (1.664 m)   Wt 276 lb 8 oz (125.4 kg)    SpO2 100%   BMI 45.31 kg/m  Body mass index is 45.31 kg/m. Physical Exam Vitals and nursing note reviewed.  Constitutional:      Appearance: Normal appearance. She is well-developed.  HENT:     Head: Normocephalic and atraumatic.     Right Ear: External ear normal.     Left Ear: External ear normal.     Nose: Nose normal.     Mouth/Throat:     Mouth: Mucous membranes are moist.     Pharynx: Oropharynx is clear.  Eyes:     Conjunctiva/sclera: Conjunctivae normal.  Cardiovascular:     Rate and Rhythm: Normal rate and regular rhythm.     Heart sounds: Normal heart sounds. No murmur heard.   No friction rub. No gallop.  Pulmonary:     Effort: Pulmonary effort is normal.     Breath sounds: Normal breath sounds. No wheezing, rhonchi or rales.  Abdominal:     Palpations: Abdomen is soft.  Musculoskeletal:     Cervical back: Neck supple.  Skin:    General: Skin is warm.     Findings: No rash.  Neurological:     Mental Status: She is alert and oriented to person, place, and time.  Psychiatric:        Behavior: Behavior normal.  The plan was reviewed with the patient/family, and all questions/concerned were addressed.  It was my pleasure to see Denzil today and participate in her care. Please feel free to contact me with any questions or concerns.  Sincerely,  Wyline Mood, DO Allergy & Immunology  [facility] office: [phone] [facility] office: [phone]

## 2021-01-27 NOTE — Assessment & Plan Note (Signed)
Perennial rhinitis symptoms for 10 years with worsening in the spring.  Tried Claritin and Zyrtec with some benefit.  Concerned about frequent sinusitis requiring antibiotics the last 4 months.  No prior allergy/ENT evaluation.  Today's skin testing showed: Borderline positive to ragweed, mold, dog.  Start environmental control measures as below.  Use over the counter antihistamines such as Zyrtec (cetirizine), Claritin (loratadine), Allegra (fexofenadine), or Xyzal (levocetirizine) daily as needed. May take twice a day during allergy flares. May switch antihistamines every few months.  Start dymista (fluticasone + azelastine nasal spray combination) 1 spray per nostril twice a day.This replaces Flonase (fluticasone) for now. If it's not covered let us know.

## 2021-01-27 NOTE — Assessment & Plan Note (Signed)
Frequent sinus infections the last 4 months.   Today's skin testing showed: Borderline positive to ragweed, mold, dog.  Discussed with patient that given above test results, I recommend ENT referral next for the frequent sinusitis to rule out any anatomical abnormalities.  Marland Kitchen Keep track of infections/antibiotics use. . If no improvement, will get basic immune evaluation next.

## 2021-01-27 NOTE — Telephone Encounter (Signed)
Per Dr. Selena Batten please refer patient to ENT for frequent Sinusitis. Thank you

## 2021-01-27 NOTE — Assessment & Plan Note (Signed)
Broke out in rash as a child after taking penicillin type antibiotics.  Continue to avoid.  Consider penicillin skin testing and in office drug challenge in the future.

## 2021-01-27 NOTE — Patient Instructions (Addendum)
Today's skin testing showed: Borderline positive to ragweed, mold, dog. Results given.  Environmental allergies Start environmental control measures as below. Use over the counter antihistamines such as Zyrtec (cetirizine), Claritin (loratadine), Allegra (fexofenadine), or Xyzal (levocetirizine) daily as needed. May take twice a day during allergy flares. May switch antihistamines every few months. Start dymista (fluticasone + azelastine nasal spray combination) 1 spray per nostril twice a day.This replaces Flonase (fluticasone) for now. If it's not covered let us know.  Refer to ENT for frequent sinusitis  Infections: Keep track of infections/antibiotics use.  Penicillin allergy: Continue to avoid. Consider penicillin skin testing and in office drug challenge in the future.  You must be off antihistamines for 3-5 days before. Plan on being in the office for 2-3 hours and must bring in the medication we are doing the oral challenge for. You must call to schedule an appointment and specify it's for a drug challenge.   Follow up in 3 months or sooner if needed.   Reducing Pollen Exposure Pollen seasons: trees (spring), grass (summer) and ragweed/weeds (fall). Keep windows closed in your home and car to lower pollen exposure.  Install air conditioning in the bedroom and throughout the house if possible.  Avoid going out in dry windy days - especially early morning. Pollen counts are highest between 5 - 10 AM and on dry, hot and windy days.  Save outside activities for late afternoon or after a heavy rain, when pollen levels are lower.  Avoid mowing of grass if you have grass pollen allergy. Be aware that pollen can also be transported indoors on people and pets.  Dry your clothes in an automatic dryer rather than hanging them outside where they might collect pollen.  Rinse hair and eyes before bedtime. Mold Control Mold and fungi can grow on a variety of surfaces provided certain  temperature and moisture conditions exist.  Outdoor molds grow on plants, decaying vegetation and soil. The major outdoor mold, Alternaria and Cladosporium, are found in very high numbers during hot and dry conditions. Generally, a late summer - fall peak is seen for common outdoor fungal spores. Rain will temporarily lower outdoor mold spore count, but counts rise rapidly when the rainy period ends. The most important indoor molds are Aspergillus and Penicillium. Dark, humid and poorly ventilated basements are ideal sites for mold growth. The next most common sites of mold growth are the bathroom and the kitchen. Outdoor (Seasonal) Mold Control Use air conditioning and keep windows closed. Avoid exposure to decaying vegetation. Avoid leaf raking. Avoid grain handling. Consider wearing a face mask if working in moldy areas.  Indoor (Perennial) Mold Control  Maintain humidity below 50%. Get rid of mold growth on hard surfaces with water, detergent and, if necessary, 5% bleach (do not mix with other cleaners). Then dry the area completely. If mold covers an area more than 10 square feet, consider hiring an indoor environmental professional. For clothing, washing with soap and water is best. If moldy items cannot be cleaned and dried, throw them away. Remove sources e.g. contaminated carpets. Repair and seal leaking roofs or pipes. Using dehumidifiers in damp basements may be helpful, but empty the water and clean units regularly to prevent mildew from forming. All rooms, especially basements, bathrooms and kitchens, require ventilation and cleaning to deter mold and mildew growth. Avoid carpeting on concrete or damp floors, and storing items in damp areas. Pet Allergen Avoidance: Contrary to popular opinion, there are no "hypoallergenic" breeds of dogs or  cats. That is because people are not allergic to an animal's hair, but to an allergen found in the animal's saliva, dander (dead skin flakes) or  urine. Pet allergy symptoms typically occur within minutes. For some people, symptoms can build up and become most severe 8 to 12 hours after contact with the animal. People with severe allergies can experience reactions in public places if dander has been transported on the pet owners' clothing. Keeping an animal outdoors is only a partial solution, since homes with pets in the yard still have higher concentrations of animal allergens. Before getting a pet, ask your allergist to determine if you are allergic to animals. If your pet is already considered part of your family, try to minimize contact and keep the pet out of the bedroom and other rooms where you spend a great deal of time. As with dust mites, vacuum carpets often or replace carpet with a hardwood floor, tile or linoleum. High-efficiency particulate air (HEPA) cleaners can reduce allergen levels over time. While dander and saliva are the source of cat and dog allergens, urine is the source of allergens from rabbits, hamsters, mice and [place] pigs; so ask a non-allergic family member to clean the animal's cage. If you have a pet allergy, talk to your allergist about the potential for allergy immunotherapy (allergy shots). This strategy can often provide long-term relief.

## 2021-01-28 ENCOUNTER — Telehealth: Payer: Self-pay | Admitting: *Deleted

## 2021-01-28 ENCOUNTER — Other Ambulatory Visit (HOSPITAL_COMMUNITY): Payer: Self-pay

## 2021-01-28 NOTE — Telephone Encounter (Signed)
PA was approved. PA has been faxed to pharmacy, labeled, and placed in bulk scanning.

## 2021-01-28 NOTE — Telephone Encounter (Signed)
PA has been submitted through Cover[employer]eds for azelastine-fluticasone and is currently pending approval/denial.  

## 2021-01-31 NOTE — Telephone Encounter (Signed)
Referral placed with [facility]- Consuello Bossier. Referral, notes and demographics faxed to [phone]. Left detailed message on patient's phone informing of this information.

## 2021-02-02 ENCOUNTER — Other Ambulatory Visit: Payer: Self-pay

## 2021-02-02 MED ORDER — FLUTICASONE PROPIONATE 50 MCG/ACT NA SUSP: 2.0000 | Freq: Every day | NASAL | 5 refills | Status: DC

## 2021-02-02 MED ORDER — AZELASTINE HCL 0.1 % NA SOLN: 2.0000 | Freq: Two times a day (BID) | NASAL | 5 refills | Status: DC

## 2021-02-08 NOTE — Telephone Encounter (Signed)
I checked Care Everywhere and the patient is scheduled with Dr Kathi Der office on 02/17/2021

## 2021-02-21 MED FILL — Pantoprazole Sodium EC Tab 40 MG (Base Equiv): ORAL | 30 days supply | Qty: 30 | Fill #3 | Status: AC

## 2021-02-22 ENCOUNTER — Other Ambulatory Visit (HOSPITAL_COMMUNITY): Payer: Self-pay

## 2021-02-24 DIAGNOSIS — J3089 Other allergic rhinitis: Secondary | ICD-10-CM | POA: Diagnosis not present

## 2021-02-24 DIAGNOSIS — Z8709 Personal history of other diseases of the respiratory system: Secondary | ICD-10-CM | POA: Diagnosis not present

## 2021-03-02 DIAGNOSIS — R300 Dysuria: Secondary | ICD-10-CM | POA: Diagnosis not present

## 2021-03-03 DIAGNOSIS — R300 Dysuria: Secondary | ICD-10-CM | POA: Diagnosis not present

## 2021-03-18 DIAGNOSIS — R200 Anesthesia of skin: Secondary | ICD-10-CM | POA: Diagnosis not present

## 2021-03-20 ENCOUNTER — Other Ambulatory Visit (HOSPITAL_COMMUNITY): Payer: Self-pay

## 2021-03-21 ENCOUNTER — Other Ambulatory Visit (HOSPITAL_COMMUNITY): Payer: Self-pay

## 2021-03-21 MED ORDER — CITALOPRAM HYDROBROMIDE 20 MG PO TABS
20.0000 mg | ORAL_TABLET | Freq: Every day | ORAL | 1 refills | Status: DC
  Filled 2021-03-21: qty 90, 90d supply, fill #0

## 2021-03-21 MED ORDER — PANTOPRAZOLE SODIUM 40 MG PO TBEC
40.0000 mg | DELAYED_RELEASE_TABLET | Freq: Every day | ORAL | 5 refills | Status: DC
  Filled 2021-03-21: qty 30, 30d supply, fill #0

## 2021-03-22 ENCOUNTER — Other Ambulatory Visit (HOSPITAL_COMMUNITY): Payer: Self-pay

## 2021-03-23 ENCOUNTER — Encounter: Payer: Self-pay | Admitting: Neurology

## 2021-03-23 ENCOUNTER — Other Ambulatory Visit (HOSPITAL_COMMUNITY): Payer: Self-pay

## 2021-03-29 ENCOUNTER — Other Ambulatory Visit (HOSPITAL_COMMUNITY): Payer: Self-pay

## 2021-04-04 ENCOUNTER — Other Ambulatory Visit (HOSPITAL_COMMUNITY): Payer: Self-pay

## 2021-04-05 ENCOUNTER — Other Ambulatory Visit (HOSPITAL_COMMUNITY): Payer: Self-pay

## 2021-04-05 MED ORDER — MONTELUKAST SODIUM 10 MG PO TABS
ORAL_TABLET | ORAL | 5 refills | Status: DC
  Filled 2021-04-05: qty 30, 30d supply, fill #0

## 2021-05-03 ENCOUNTER — Ambulatory Visit: Payer: 59 | Admitting: Allergy

## 2021-05-05 ENCOUNTER — Ambulatory Visit: Payer: 59 | Admitting: Allergy

## 2021-05-05 ENCOUNTER — Other Ambulatory Visit (HOSPITAL_COMMUNITY): Payer: Self-pay

## 2021-05-05 ENCOUNTER — Encounter: Payer: Self-pay | Admitting: Allergy

## 2021-05-05 ENCOUNTER — Other Ambulatory Visit: Payer: Self-pay

## 2021-05-05 VITALS — BP 118/60 | HR 84 | Temp 98.3°F | Resp 18 | Ht 65.5 in | Wt 282.4 lb

## 2021-05-05 DIAGNOSIS — H1013 Acute atopic conjunctivitis, bilateral: Secondary | ICD-10-CM | POA: Insufficient documentation

## 2021-05-05 DIAGNOSIS — J3089 Other allergic rhinitis: Secondary | ICD-10-CM | POA: Diagnosis not present

## 2021-05-05 DIAGNOSIS — J329 Chronic sinusitis, unspecified: Secondary | ICD-10-CM

## 2021-05-05 DIAGNOSIS — Z880 Allergy status to penicillin: Secondary | ICD-10-CM | POA: Insufficient documentation

## 2021-05-05 MED ORDER — MONTELUKAST SODIUM 10 MG PO TABS
10.0000 mg | ORAL_TABLET | Freq: Every day | ORAL | 5 refills | Status: DC
  Filled 2021-05-05: qty 30, 30d supply, fill #0
  Filled 2021-05-30: qty 30, 30d supply, fill #1
  Filled 2021-07-03: qty 30, 30d supply, fill #2
  Filled 2021-07-31: qty 30, 30d supply, fill #3
  Filled 2021-09-13: qty 30, 30d supply, fill #4
  Filled 2021-10-10: qty 30, 30d supply, fill #5

## 2021-05-05 MED ORDER — LEVOCETIRIZINE DIHYDROCHLORIDE 5 MG PO TABS
5.0000 mg | ORAL_TABLET | Freq: Every evening | ORAL | 5 refills | Status: DC
  Filled 2021-05-05: qty 30, 30d supply, fill #0
  Filled 2021-05-30: qty 30, 30d supply, fill #1
  Filled 2021-07-03: qty 30, 30d supply, fill #2
  Filled 2021-07-31: qty 30, 30d supply, fill #3
  Filled 2021-09-13: qty 30, 30d supply, fill #4
  Filled 2021-10-10: qty 30, 30d supply, fill #5

## 2021-05-05 MED ORDER — OLOPATADINE HCL 0.2 % OP SOLN
1.0000 [drp] | Freq: Every day | OPHTHALMIC | 5 refills | Status: DC | PRN
  Filled 2021-05-05: qty 2.5, 25d supply, fill #0
  Filled 2021-08-22: qty 2.5, 25d supply, fill #1
  Filled 2022-01-15: qty 2.5, 25d supply, fill #2

## 2021-05-05 NOTE — Assessment & Plan Note (Signed)
.   See assessment and plan as above. 

## 2021-05-05 NOTE — Assessment & Plan Note (Signed)
Past history - Broke out in rash as a child after taking penicillin type antibiotics. ?? Continue to avoid. ?? Consider penicillin allergy skin testing and in office drug challenge in the future.  ?? Over 90% of people with history of penicillin allergy which occurred over 10 years ago are found to be non-allergic.  ?

## 2021-05-05 NOTE — Patient Instructions (Addendum)
Environmental allergies 2022 skin testing showed: Borderline positive to ragweed, mold, dog. Continue environmental control measures as below. Take zyrtec 10mg  daily in the morning.  Take Xyzal 5mg  at night instead of Claritin.  Continue Singulair (montelukast) 10mg  daily at night. Start Ryaltris (olopatadine + mometasone nasal spray combination) 1-2 sprays per nostril twice a day. If this works well for you let me know and I will send in a prescription. It's $45/bottle.  Stop dymista. Use olopatadine eye drops 0.2% once a day as needed for itchy/watery eyes. Wait 10-15 minutes before putting contact lens in.  Use refresh eye drops as needed.   Infections: Keep track of infections/antibiotics use.  Penicillin allergy: Continue to avoid. Consider penicillin allergy skin testing and in office drug challenge in the future.  Over 90% of people with history of penicillin allergy which occurred over 10 years ago are found to be non-allergic.   Follow up in 6 months or sooner if needed.   Reducing Pollen Exposure Pollen seasons: trees (spring), grass (summer) and ragweed/weeds (fall). Keep windows closed in your home and car to lower pollen exposure.  Install air conditioning in the bedroom and throughout the house if possible.  Avoid going out in dry windy days - especially early morning. Pollen counts are highest between 5 - 10 AM and on dry, hot and windy days.  Save outside activities for late afternoon or after a heavy rain, when pollen levels are lower.  Avoid mowing of grass if you have grass pollen allergy. Be aware that pollen can also be transported indoors on people and pets.  Dry your clothes in an automatic dryer rather than hanging them outside where they might collect pollen.  Rinse hair and eyes before bedtime. Mold Control Mold and fungi can grow on a variety of surfaces provided certain temperature and moisture conditions exist.  Outdoor molds grow on plants, decaying  vegetation and soil. The major outdoor mold, Alternaria and Cladosporium, are found in very high numbers during hot and dry conditions. Generally, a late summer - fall peak is seen for common outdoor fungal spores. Rain will temporarily lower outdoor mold spore count, but counts rise rapidly when the rainy period ends. The most important indoor molds are Aspergillus and Penicillium. Dark, humid and poorly ventilated basements are ideal sites for mold growth. The next most common sites of mold growth are the bathroom and the kitchen. Outdoor (Seasonal) Mold Control Use air conditioning and keep windows closed. Avoid exposure to decaying vegetation. Avoid leaf raking. Avoid grain handling. Consider wearing a face mask if working in moldy areas.  Indoor (Perennial) Mold Control  Maintain humidity below 50%. Get rid of mold growth on hard surfaces with water, detergent and, if necessary, 5% bleach (do not mix with other cleaners). Then dry the area completely. If mold covers an area more than 10 square feet, consider hiring an indoor environmental professional. For clothing, washing with soap and water is best. If moldy items cannot be cleaned and dried, throw them away. Remove sources e.g. contaminated carpets. Repair and seal leaking roofs or pipes. Using dehumidifiers in damp basements may be helpful, but empty the water and clean units regularly to prevent mildew from forming. All rooms, especially basements, bathrooms and kitchens, require ventilation and cleaning to deter mold and mildew growth. Avoid carpeting on concrete or damp floors, and storing items in damp areas. Pet Allergen Avoidance: Contrary to popular opinion, there are no "hypoallergenic" breeds of dogs or cats. That is because people  are not allergic to an animal's hair, but to an allergen found in the animal's saliva, dander (dead skin flakes) or urine. Pet allergy symptoms typically occur within minutes. For some people, symptoms  can build up and become most severe 8 to 12 hours after contact with the animal. People with severe allergies can experience reactions in public places if dander has been transported on the pet owners' clothing. Keeping an animal outdoors is only a partial solution, since homes with pets in the yard still have higher concentrations of animal allergens. Before getting a pet, ask your allergist to determine if you are allergic to animals. If your pet is already considered part of your family, try to minimize contact and keep the pet out of the bedroom and other rooms where you spend a great deal of time. As with dust mites, vacuum carpets often or replace carpet with a hardwood floor, tile or linoleum. High-efficiency particulate air (HEPA) cleaners can reduce allergen levels over time. While dander and saliva are the source of cat and dog allergens, urine is the source of allergens from rabbits, hamsters, mice and [place] pigs; so ask a non-allergic family member to clean the animal's cage. If you have a pet allergy, talk to your allergist about the potential for allergy immunotherapy (allergy shots). This strategy can often provide long-term relief.

## 2021-05-05 NOTE — Progress Notes (Signed)
Follow Up Note  RE: Hailey Cruz MRN: [id number] DOB: Mar 14, 1996 Date of Office Visit: 05/05/2021  Referring provider: [place], Julie M, NP Primary care provider: [place], Julie M, NP  Chief Complaint: Allergic Rhinitis  (Pt states overall symptoms have been well managed, still having watery itchy eyes.)  History of Present Illness: I had the pleasure of seeing Hailey Cruz for a follow up visit at the [facility] on 05/05/2021. She is a 25 y.o. female, who is being followed for allergic rhinitis, and penicillin allergy. Her previous allergy office visit was on 01/27/2021 with Dr. Selena Batten. Today is a regular follow up visit.  Rhinitis:  Currently taking Singulair 10mg  daily and Claritin with good benefit.  Eyes are itchy/watery.  Patient does wear contacts.  Using dymista as needed with good benefit but it causes some metallic taste.    Frequent episodes of sinusitis No antibiotics or sinus infections since the last visit.     02/24/2021 ENT visit: "Plan: We will try to medically maximize her allergy management by adding Singulair. We discussed that coming in twice a year for steroid injection for flares of allergies is reasonable. I would like to see her when she has her next sinus infection to evaluate her."  Assessment and Plan: Valoria is a 25 y.o. female with: Other allergic rhinitis Past history - Perennial rhinitis symptoms for 10 years with worsening in the spring.  Tried Claritin and Zyrtec with some benefit.  Concerned about frequent sinusitis requiring antibiotics the last 4 months.  2022 skin testing showed: Borderline positive to ragweed, mold, dog. Interim history - itchy eyes, sinus symptoms better. Dymista tastes metallic. Continue environmental control measures as below. Take zyrtec 10mg  daily in the morning.  Take Xyzal 5mg  at night instead of Claritin.  Continue Singulair (montelukast) 10mg  daily at night. Start Ryaltris (olopatadine + mometasone  nasal spray combination) 1-2 sprays per nostril twice a day. If this works well for you let me know and I will send in a prescription.  Stop dymista. Use olopatadine eye drops 0.2% once a day as needed for itchy/watery eyes. Wait 10-15 minutes before putting contact lens in.  Use refresh eye drops as needed.   Allergic conjunctivitis of both eyes See assessment and plan as above.  Frequent episodes of sinusitis Past history - Frequent sinus infections the last 4 months.  Interim history - ENT evaluation unremarkable. No additional infections. Keep track of infections/antibiotics use.  Penicillin allergy Past history - Broke out in rash as a child after taking penicillin type antibiotics. Continue to avoid. Consider penicillin allergy skin testing and in office drug challenge in the future.  Over 90% of people with history of penicillin allergy which occurred over 10 years ago are found to be non-allergic.   Return in about 6 months (around 11/03/2021).  Meds ordered this encounter  Medications   Olopatadine HCl 0.2 % SOLN    Sig: Apply 1 drop to eye(s) daily as needed (itchy/watery eyes).    Dispense:  2.5 mL    Refill:  5   montelukast (SINGULAIR) 10 MG tablet    Sig: Take 1 tablet (10 mg total) by mouth at bedtime.    Dispense:  30 tablet    Refill:  5   levocetirizine (XYZAL) 5 MG tablet    Sig: Take 1 tablet (5 mg total) by mouth every evening.    Dispense:  30 tablet    Refill:  5    Lab Orders  No laboratory test(s) ordered today    Diagnostics: None.   Medication List:  Current Outpatient Medications  Medication Sig Dispense Refill   levocetirizine (XYZAL) 5 MG tablet Take 1 tablet (5 mg total) by mouth every evening. 30 tablet 5   liraglutide (VICTOZA) 18 MG/3ML SOPN Inject into the skin.     montelukast (SINGULAIR) 10 MG tablet Take 1 tablet (10 mg total) by mouth at bedtime. 30 tablet 5   Olopatadine HCl 0.2 % SOLN Apply 1 drop to eye(s) daily as needed  (itchy/watery eyes). 2.5 mL 5   pantoprazole (PROTONIX) 40 MG tablet TAKE 1 TABLET BY MOUTH DAILY 30 tablet 5   sertraline (ZOLOFT) 100 MG tablet Take 10 mg by mouth daily.     No current facility-administered medications for this visit.   Allergies: Allergies  Allergen Reactions   Penicillins Hives and Rash   I reviewed her past medical history, social history, family history, and environmental history and no significant changes have been reported from her previous visit.  Review of Systems  Constitutional:  Negative for appetite change, chills, fever and unexpected weight change.  HENT:  Positive for congestion, postnasal drip and rhinorrhea.   Eyes:  Positive for itching.  Respiratory:  Negative for cough, chest tightness, shortness of breath and wheezing.   Cardiovascular:  Negative for chest pain.  Gastrointestinal:  Negative for abdominal pain.  Genitourinary:  Negative for difficulty urinating.  Skin:  Negative for rash.  Neurological:  Positive for headaches.   Objective: BP 118/60   Pulse 84   Temp 98.3 F (36.8 C) (Temporal)   Resp 18   Ht 5' 5.5" (1.664 m)   Wt 282 lb 6.4 oz (128.1 kg)   SpO2 99%   BMI 46.28 kg/m  Body mass index is 46.28 kg/m. Physical Exam Vitals and nursing note reviewed.  Constitutional:      Appearance: Normal appearance. She is well-developed.  HENT:     Head: Normocephalic and atraumatic.     Right Ear: External ear normal.     Left Ear: External ear normal.     Nose: Nose normal.     Mouth/Throat:     Mouth: Mucous membranes are moist.     Pharynx: Oropharynx is clear.  Eyes:     Conjunctiva/sclera: Conjunctivae normal.  Cardiovascular:     Rate and Rhythm: Normal rate and regular rhythm.     Heart sounds: Normal heart sounds. No murmur heard.   No friction rub. No gallop.  Pulmonary:     Effort: Pulmonary effort is normal.     Breath sounds: Normal breath sounds. No wheezing, rhonchi or rales.  Abdominal:     Palpations:  Abdomen is soft.  Musculoskeletal:     Cervical back: Neck supple.  Skin:    General: Skin is warm.     Findings: No rash.  Neurological:     Mental Status: She is alert and oriented to person, place, and time.  Psychiatric:        Behavior: Behavior normal.   Previous notes and tests were reviewed. The plan was reviewed with the patient/family, and all questions/concerned were addressed.  It was my pleasure to see Altair today and participate in her care. Please feel free to contact me with any questions or concerns.  Sincerely,  Wyline Mood, DO Allergy & Immunology  [facility] office: [phone] [facility] office: [phone]

## 2021-05-05 NOTE — Assessment & Plan Note (Signed)
Past history - Perennial rhinitis symptoms for 10 years with worsening in the spring.  Tried Claritin and Zyrtec with some benefit.  Concerned about frequent sinusitis requiring antibiotics the last 4 months.  2022 skin testing showed: Borderline positive to ragweed, mold, dog. Interim history - itchy eyes, sinus symptoms better. Dymista tastes metallic.  Continue environmental control measures as below.  Take zyrtec 10mg  daily in the morning.   Take Xyzal 5mg  at night instead of Claritin.   Continue Singulair (montelukast) 10mg  daily at night. . Start Ryaltris (olopatadine + mometasone nasal spray combination) 1-2 sprays per nostril twice a day. o If this works well for you let me know and I will send in a prescription.  . Stop dymista. . Use olopatadine eye drops 0.2% once a day as needed for itchy/watery eyes. o Wait 10-15 minutes before putting contact lens in.  . Use refresh eye drops as needed.

## 2021-05-05 NOTE — Assessment & Plan Note (Signed)
Past history - Frequent sinus infections the last 4 months.  Interim history - ENT evaluation unremarkable. No additional infections. Marland Kitchen Keep track of infections/antibiotics use.

## 2021-05-06 ENCOUNTER — Other Ambulatory Visit (HOSPITAL_COMMUNITY): Payer: Self-pay

## 2021-05-09 ENCOUNTER — Other Ambulatory Visit (HOSPITAL_COMMUNITY): Payer: Self-pay

## 2021-05-09 MED ORDER — PANTOPRAZOLE SODIUM 40 MG PO TBEC
40.0000 mg | DELAYED_RELEASE_TABLET | Freq: Every day | ORAL | 5 refills | Status: DC
  Filled 2021-05-09: qty 30, 30d supply, fill #0
  Filled 2021-05-30: qty 30, 30d supply, fill #1
  Filled 2021-07-03: qty 30, 30d supply, fill #2
  Filled 2021-07-31: qty 30, 30d supply, fill #3
  Filled 2021-09-13: qty 30, 30d supply, fill #4
  Filled 2021-10-10: qty 30, 30d supply, fill #5

## 2021-05-30 ENCOUNTER — Other Ambulatory Visit: Payer: Self-pay

## 2021-05-30 ENCOUNTER — Ambulatory Visit: Payer: 59 | Admitting: Neurology

## 2021-05-30 ENCOUNTER — Encounter: Payer: Self-pay | Admitting: Neurology

## 2021-05-30 ENCOUNTER — Other Ambulatory Visit (HOSPITAL_COMMUNITY): Payer: Self-pay

## 2021-05-30 VITALS — BP 127/83 | HR 89 | Ht 65.5 in | Wt 284.0 lb

## 2021-05-30 DIAGNOSIS — R202 Paresthesia of skin: Secondary | ICD-10-CM | POA: Diagnosis not present

## 2021-05-30 DIAGNOSIS — N3000 Acute cystitis without hematuria: Secondary | ICD-10-CM | POA: Diagnosis not present

## 2021-05-30 DIAGNOSIS — R300 Dysuria: Secondary | ICD-10-CM | POA: Diagnosis not present

## 2021-05-30 NOTE — Progress Notes (Signed)
[facility] Clinic Note - Initial Visit   Date: 05/30/21  Hailey Cruz MRN: [id number] DOB: September 24, 1995   Dear Rolm Baptise:  Thank you for your kind referral of Hailey Cruz for consultation of leg numbness. Although her history is well known to you, please allow [place] to reiterate it for the purpose of our medical record. The patient was accompanied to the clinic by mother who also provides collateral information.     History of Present Illness: Hailey Cruz is a 25 y.o. right-handed female with anxiety presenting for evaluation of bilateral leg numbness.   For the past two years, she reports having numbness involving tingling in the lower legs legs and feet which typically occurs after she has been sitting about 30-minutes, such as when driving to work. She has some weakness after she tries to get up.  Walking alleviates her symptoms.  No tingling when she is standing or walking. Symptoms occur about 2-3 times per week. She denies any falls. No numbness/tingling in the hands.  No radicular leg pain.   He works as [employer] as a Engineer, site.    Mother has history of degenerative spine disease.    Past Medical History:  Diagnosis Date   Eczema    Urticaria     History reviewed. No pertinent surgical history.   Medications:  Outpatient Encounter Medications as of 05/30/2021  Medication Sig   cholecalciferol (VITAMIN D3) 25 MCG (1000 UNIT) tablet Take 1,000 Units by mouth daily.   citalopram (CELEXA) 20 MG tablet Take 1 tablet by mouth daily.   ferrous sulfate 325 (65 FE) MG EC tablet Take 325 mg by mouth daily with breakfast.   levocetirizine (XYZAL) 5 MG tablet Take 1 tablet (5 mg total) by mouth every evening.   liraglutide (VICTOZA) 18 MG/3ML SOPN Inject into the skin.   montelukast (SINGULAIR) 10 MG tablet Take 1 tablet (10 mg total) by mouth at bedtime.   Olopatadine HCl 0.2 % SOLN Apply 1 drop to eye(s) daily as  needed (itchy/watery eyes).   pantoprazole (PROTONIX) 40 MG tablet TAKE 1 TABLET BY MOUTH DAILY   [DISCONTINUED] sertraline (ZOLOFT) 100 MG tablet Take 10 mg by mouth daily. (Patient not taking: Reported on 05/30/2021)   No facility-administered encounter medications on file as of 05/30/2021.    Allergies:  Allergies  Allergen Reactions   Penicillins Hives and Rash    Family History: Family History  Problem Relation Age of Onset   Eczema Mother    Allergic rhinitis Mother    Diabetes Mother    Hypertension Mother    Eczema Father    Allergic rhinitis Father    Diabetes Father    Eczema Brother    Allergic rhinitis Brother    Asthma Neg Hx    Urticaria Neg Hx     Social History: Social History   Tobacco Use   Smoking status: Never   Smokeless tobacco: Never  Vaping Use   Vaping Use: Never used  Substance Use Topics   Alcohol use: Yes    Comment: occasionally    Drug use: Never   Social History   Social History Narrative   Right Handed    Lives in a one story home     Vital Signs:  BP 127/83   Pulse 89   Ht 5' 5.5" (1.664 m)   Wt 284 lb (128.8 kg)   SpO2 97%   BMI 46.54 kg/m   Neurological Exam: MENTAL STATUS including orientation to  time, place, person, recent and remote memory, attention span and concentration, language, and fund of knowledge is normal.  Speech is not dysarthric.  CRANIAL NERVES: II:  No visual field defects.   III-IV-VI: Pupils equal round and reactive to light.  Normal conjugate, extra-ocular eye movements in all directions of gaze.  No nystagmus.  No ptosis.   V:  Normal facial sensation.    VII:  Normal facial symmetry and movements.   VIII:  Normal hearing and vestibular function.   IX-X:  Normal palatal movement.   XI:  Normal shoulder shrug and head rotation.   XII:  Normal tongue strength and range of motion, no deviation or fasciculation.  MOTOR:  No atrophy, fasciculations or abnormal movements.  No pronator drift.    Upper Extremity:  Right  Left  Deltoid  5/5   5/5   Biceps  5/5   5/5   Triceps  5/5   5/5   Infraspinatus 5/5  5/5  Medial pectoralis 5/5  5/5  Wrist extensors  5/5   5/5   Wrist flexors  5/5   5/5   Finger extensors  5/5   5/5   Finger flexors  5/5   5/5   Dorsal interossei  5/5   5/5   Abductor pollicis  5/5   5/5   Tone (Ashworth scale)  0  0   Lower Extremity:  Right  Left  Hip flexors  5/5   5/5   Hip extensors  5/5   5/5   Adductor 5/5  5/5  Abductor 5/5  5/5  Knee flexors  5/5   5/5   Knee extensors  5/5   5/5   Dorsiflexors  5/5   5/5   Plantarflexors  5/5   5/5   Toe extensors  5/5   5/5   Toe flexors  5/5   5/5   Tone (Ashworth scale)  0  0   MSRs:  Right        Left                  brachioradialis 2+  2+  biceps 2+  2+  triceps 2+  2+  patellar 2+  2+  ankle jerk 2+  2+  Hoffman no  no  plantar response down  down   SENSORY:  Normal and symmetric perception of light touch, pinprick, vibration, and proprioception.  Romberg's sign absent.   COORDINATION/GAIT: Normal finger-to- nose-finger.  Intact rapid alternating movements bilaterally.  Able to rise from a chair without using arms.  Gait narrow based and stable. Tandem and stressed gait intact.    IMPRESSION: Bilateral lower leg tingling with prolonged sitting, most likely transient compression of sciatic nerve. Normal neurological exam makes lumbosacral pathology and neuropathy unlikely. I will check NCS/EMG to be evaluate her symptoms.  Further recommendations pending results.   Thank you for allowing me to participate in patient's care.  If I can answer any additional questions, I would be pleased to do so.    Sincerely,    Zeva Leber K. Allena Katz, DO

## 2021-05-30 NOTE — Patient Instructions (Addendum)

## 2021-06-02 ENCOUNTER — Other Ambulatory Visit (HOSPITAL_COMMUNITY): Payer: Self-pay

## 2021-06-05 ENCOUNTER — Other Ambulatory Visit (HOSPITAL_COMMUNITY): Payer: Self-pay

## 2021-06-05 MED ORDER — MUPIROCIN 2 % EX OINT
TOPICAL_OINTMENT | CUTANEOUS | 0 refills | Status: DC
  Filled 2021-06-05: qty 22, 10d supply, fill #0

## 2021-06-06 ENCOUNTER — Other Ambulatory Visit (HOSPITAL_COMMUNITY): Payer: Self-pay

## 2021-06-23 ENCOUNTER — Ambulatory Visit: Payer: 59 | Admitting: Neurology

## 2021-06-23 ENCOUNTER — Other Ambulatory Visit: Payer: Self-pay

## 2021-06-23 DIAGNOSIS — R202 Paresthesia of skin: Secondary | ICD-10-CM

## 2021-06-23 NOTE — Procedures (Signed)
[facility]rology  [address] [place], [address]  [place], [place] [address] Tel: [phone] Fax:  [phone] Test Date:  06/23/2021  Patient: Hailey Cruz DOB: 1996/04/29 Physician: Nita Sickle, DO  Sex: Female Height: 5\' 5"  Ref Phys: , DO  ID#: Nita Sickle   Technician:    Patient Complaints: This is a 25 year old female referred for evaluation of bilateral leg paresthesias.  NCV & EMG Findings: Electrodiagnostic testing of the right lower extremity and additional studies of the left shows: Bilateral sural and superficial peroneal sensory responses are within normal limits. Bilateral peroneal and tibial motor responses are within normal limits. Bilateral tibial H reflex studies are within normal limits. There is no evidence of active or chronic motor axonal changes affecting any of the tested muscles.  Motor unit configuration and recruitment pattern is within normal limits.  Impression: This is a normal study of the lower extremities.  In particular, there is no evidence of a sensorimotor polyneuropathy or lumbosacral radiculopathy.    ___________________________ 25, DO    Nerve Conduction Studies Anti Sensory Summary Table   Stim Site NR Peak (ms) Norm Peak (ms) P-T Amp (V) Norm P-T Amp  Left Sup Peroneal Anti Sensory (Ant Lat Mall)  32C  12 cm    2.0 <4.4 11.0 >6  Right Sup Peroneal Anti Sensory (Ant Lat Mall)  32C  12 cm    2.2 <4.4 12.7 >6  Left Sural Anti Sensory (Lat Mall)  32C  Calf    3.2 <4.4 22.1 >6  Right Sural Anti Sensory (Lat Mall)  32C  Calf    3.5 <4.4 21.6 >6   Motor Summary Table   Stim Site NR Onset (ms) Norm Onset (ms) O-P Amp (mV) Norm O-P Amp Site1 Site2 Delta-0 (ms) Dist (cm) Vel (m/s) Norm Vel (m/s)  Left Peroneal Motor (Ext Dig Brev)  32C  Ankle    3.9 <5.5 5.7 >3 B Fib Ankle 6.5 37.0 57 >41  B Fib    10.4  5.5  Poplt B Fib 1.2 7.0 58 >41  Poplt    11.6  5.4         Right Peroneal Motor (Ext Dig Brev)   32C  Ankle    2.7 <5.5 5.0 >3 B Fib Ankle 6.8 38.0 56 >41  B Fib    9.5  4.7  Poplt B Fib 1.3 8.0 62 >41  Poplt    10.8  4.7         Left Tibial Motor (Abd Hall Brev)  32C  Ankle    2.7 <5.8 13.7 >8 Knee Ankle 7.6 41.0 54 >41  Knee    10.3  11.7         Right Tibial Motor (Abd Hall Brev)  32C  Ankle    2.1 <5.8 12.3 >8 Knee Ankle 8.4 43.0 51 >41  Knee    10.5  10.8          H Reflex Studies   NR H-Lat (ms) Lat Norm (ms) L-R H-Lat (ms)  Left Tibial (Gastroc)  32C     31.97 <35 0.41  Right Tibial (Gastroc)  32C     32.38 <35 0.41   EMG   Side Muscle Ins Act Fibs Psw Fasc Number Recrt Dur Dur. Amp Amp. Poly Poly. Comment  Right AntTibialis Nml Nml Nml Nml Nml Nml Nml Nml Nml Nml Nml Nml N/A  Right Gastroc Nml Nml Nml Nml Nml Nml Nml Nml Nml Nml Nml Nml  N/A  Right Flex Dig Long Nml Nml Nml Nml Nml Nml Nml Nml Nml Nml Nml Nml N/A  Right RectFemoris Nml Nml Nml Nml Nml Nml Nml Nml Nml Nml Nml Nml N/A  Right GluteusMed Nml Nml Nml Nml Nml Nml Nml Nml Nml Nml Nml Nml N/A  Left AntTibialis Nml Nml Nml Nml Nml Nml Nml Nml Nml Nml Nml Nml N/A  Left Gastroc Nml Nml Nml Nml Nml Nml Nml Nml Nml Nml Nml Nml N/A  Left Flex Dig Long Nml Nml Nml Nml Nml Nml Nml Nml Nml Nml Nml Nml N/A  Left RectFemoris Nml Nml Nml Nml Nml Nml Nml Nml Nml Nml Nml Nml N/A  Left GluteusMed Nml Nml Nml Nml Nml Nml Nml Nml Nml Nml Nml Nml N/A      Waveforms:

## 2021-07-04 ENCOUNTER — Other Ambulatory Visit (HOSPITAL_COMMUNITY): Payer: Self-pay

## 2021-07-07 ENCOUNTER — Other Ambulatory Visit (HOSPITAL_COMMUNITY): Payer: Self-pay

## 2021-07-07 MED ORDER — CITALOPRAM HYDROBROMIDE 20 MG PO TABS
ORAL_TABLET | Freq: Every day | ORAL | 1 refills | Status: DC
  Filled 2021-07-07: qty 90, 90d supply, fill #0
  Filled 2021-09-13: qty 90, 90d supply, fill #1

## 2021-08-01 ENCOUNTER — Other Ambulatory Visit (HOSPITAL_COMMUNITY): Payer: Self-pay

## 2021-08-23 ENCOUNTER — Other Ambulatory Visit (HOSPITAL_COMMUNITY): Payer: Self-pay

## 2021-09-13 ENCOUNTER — Other Ambulatory Visit (HOSPITAL_COMMUNITY): Payer: Self-pay

## 2021-09-14 ENCOUNTER — Other Ambulatory Visit (HOSPITAL_COMMUNITY): Payer: Self-pay

## 2021-09-17 ENCOUNTER — Other Ambulatory Visit (HOSPITAL_COMMUNITY): Payer: Self-pay

## 2021-10-11 ENCOUNTER — Other Ambulatory Visit (HOSPITAL_COMMUNITY): Payer: Self-pay

## 2021-10-13 ENCOUNTER — Other Ambulatory Visit (HOSPITAL_COMMUNITY): Payer: Self-pay

## 2021-10-13 MED ORDER — CIMETIDINE 300 MG PO TABS
ORAL_TABLET | ORAL | 99 refills | Status: AC
  Filled 2021-10-13: qty 90, 30d supply, fill #0
  Filled 2021-12-04: qty 10, 3d supply, fill #1
  Filled 2021-12-06: qty 80, 27d supply, fill #1
  Filled 2022-01-09: qty 20, 6d supply, fill #2
  Filled 2022-01-11: qty 70, 24d supply, fill #2

## 2021-10-13 MED ORDER — TRIAMCINOLONE ACETONIDE 0.1 % EX CREA
TOPICAL_CREAM | CUTANEOUS | 3 refills | Status: AC
  Filled 2021-10-13: qty 454, 90d supply, fill #0

## 2021-10-17 ENCOUNTER — Other Ambulatory Visit (HOSPITAL_COMMUNITY): Payer: Self-pay

## 2021-11-06 ENCOUNTER — Other Ambulatory Visit (HOSPITAL_COMMUNITY): Payer: Self-pay

## 2021-11-06 ENCOUNTER — Other Ambulatory Visit: Payer: Self-pay | Admitting: Allergy

## 2021-11-07 ENCOUNTER — Other Ambulatory Visit (HOSPITAL_COMMUNITY): Payer: Self-pay

## 2021-11-07 MED ORDER — PANTOPRAZOLE SODIUM 40 MG PO TBEC
40.0000 mg | DELAYED_RELEASE_TABLET | Freq: Every day | ORAL | 5 refills | Status: AC
  Filled 2021-11-07: qty 30, 30d supply, fill #0
  Filled 2021-12-04: qty 30, 30d supply, fill #1
  Filled 2022-01-09: qty 30, 30d supply, fill #2

## 2021-11-16 DIAGNOSIS — H1010 Acute atopic conjunctivitis, unspecified eye: Secondary | ICD-10-CM | POA: Insufficient documentation

## 2021-11-16 NOTE — Progress Notes (Signed)
? ?Follow Up Note ? ?RE: Hailey Cruz Droll MRN: [id number] DOB: [birth year] ?Date of Office Visit: 11/17/2021 ? ?Referring provider: [place], Julie M, NP ?Primary care provider: Pcp, No ? ?Chief Complaint: Allergic Rhinitis  ? ?History of Present Illness: ?I had the pleasure of seeing Hailey Cruz for a follow up visit at the [facility] on 11/17/2021. She is a 26 y.o. female, who is being followed for allergic rhinoconjunctivitis, frequent sinusitis and penicillin allergy. Her previous allergy office visit was on 05/05/2021 with Dr. Selena BattenKim. Today is a regular follow up visit. ? ?Allergic rhino conjunctivitis ?Patient missed 3 doses of her allergy medications which flared her sinus symptoms. ?Currently taking Xyzal and Singulair at night, Claritin 10mg  in the morning. ? ?Saw dermatology and started on tagamet TID to help with the itching.  ? ?Tried Ryaltris but didn't like the taste of it. Has dymista at home. ?Noticed some ear fullness lately. ? ?Using eye drops as needed with good benefit.  ? ?Frequent episodes of sinusitis ?No sinus infections but treated for strep throat with azithromycin and not sure if it helped.  ? ?Assessment and Plan: ?Hailey Cruz is a 26 y.o. female with: ?Seasonal and perennial allergic rhinoconjunctivitis ?Past history - Perennial rhinitis symptoms for 10 years with worsening in the spring.  Tried Claritin and Zyrtec with some benefit.  Concerned about frequent sinusitis requiring antibiotics the last 4 months.  2022 skin testing showed: Borderline positive to ragweed, mold, dog. ?Interim history - did not like taste of Ryaltris. ?Continue environmental control measures as below. ?Take loratadine in the morning.  ?Take Xyzal 5mg  at night.  ?Continue Singulair (montelukast) 10mg  daily at night. ?Nasal saline spray (i.e., Simply Saline) or nasal saline lavage (i.e., NeilMed) is recommended as needed and prior to medicated nasal sprays. ?May use dymista (fluticasone + azelastine  nasal spray combination) 1 spray per nostril twice a day as needed for nasal symptoms.  ?Use olopatadine eye drops 0.2% once a day as needed for itchy/watery eyes. ?Wait 10-15 minutes before putting contact lens in.  ?Use refresh eye drops as needed.  ? ?Frequent episodes of sinusitis ?Past history - Frequent sinus infections the last 4 months. ENT evaluation unremarkable. ?Interim history -  Had strep throat.  ?Keep track of infections/antibiotics use. ? ?Penicillin allergy ?Past history - Broke out in rash as a child after taking penicillin type antibiotics. ?Continue to avoid. ?Consider penicillin allergy skin testing and in office drug challenge in the future.  ?Over 90% of people with history of penicillin allergy which occurred over 10 years ago are found to be non-allergic.  ? ?Return in about 6 months (around 05/19/2022) for Drug challenge. ? ?Meds ordered this encounter  ?Medications  ? DISCONTD: montelukast (SINGULAIR) 10 MG tablet  ?  Sig: Take 1 tablet (10 mg total) by mouth at bedtime.  ?  Dispense:  90 tablet  ?  Refill:  2  ? DISCONTD: levocetirizine (XYZAL) 5 MG tablet  ?  Sig: Take 1 tablet (5 mg total) by mouth every evening.  ?  Dispense:  90 tablet  ?  Refill:  2  ? levocetirizine (XYZAL) 5 MG tablet  ?  Sig: Take 1 tablet (5 mg total) by mouth every evening.  ?  Dispense:  90 tablet  ?  Refill:  2  ? montelukast (SINGULAIR) 10 MG tablet  ?  Sig: Take 1 tablet (10 mg total) by mouth at bedtime.  ?  Dispense:  90 tablet  ?  Refill:  2  ? ?Lab Orders  ?No laboratory test(s) ordered today  ? ? ?Diagnostics: ?None.  ? ?Medication List:  ?Current Outpatient Medications  ?Medication Sig Dispense Refill  ? cholecalciferol (VITAMIN D3) 25 MCG (1000 UNIT) tablet Take 1,000 Units by mouth daily.    ? cimetidine (TAGAMET) 300 MG tablet Take 1 tablet by mouth 3 times a day. 90 tablet PRN  ? citalopram (CELEXA) 20 MG tablet Take 1 tablet by mouth daily.    ? ferrous sulfate 325 (65 FE) MG EC tablet Take 325  mg by mouth daily with breakfast.    ? pantoprazole (PROTONIX) 40 MG tablet TAKE 1 TABLET BY MOUTH DAILY 30 tablet 5  ? levocetirizine (XYZAL) 5 MG tablet Take 1 tablet (5 mg total) by mouth every evening. 90 tablet 2  ? liraglutide (VICTOZA) 18 MG/3ML SOPN Inject into the skin. (Patient not taking: Reported on 11/17/2021)    ? montelukast (SINGULAIR) 10 MG tablet Take 1 tablet (10 mg total) by mouth at bedtime. 90 tablet 2  ? Olopatadine HCl 0.2 % SOLN Apply 1 drop to eye(s) daily as needed (itchy/watery eyes). (Patient not taking: Reported on 11/17/2021) 2.5 mL 5  ? triamcinolone cream (KENALOG) 0.1 % Apply topically to affected areas twice daily as needed for itching. (Do not apply to face groin or underarms) (Patient not taking: Reported on 11/17/2021) 454 g 3  ? ?No current facility-administered medications for this visit.  ? ?Allergies: ?Allergies  ?Allergen Reactions  ? Penicillins Hives and Rash  ? ?I reviewed her past medical history, social history, family history, and environmental history and no significant changes have been reported from her previous visit. ? ?Review of Systems  ?Constitutional:  Negative for appetite change, chills, fever and unexpected weight change.  ?HENT:  Positive for congestion, postnasal drip, rhinorrhea and sore throat.   ?Eyes:  Positive for itching.  ?Respiratory:  Negative for cough, chest tightness, shortness of breath and wheezing.   ?Cardiovascular:  Negative for chest pain.  ?Gastrointestinal:  Negative for abdominal pain.  ?Genitourinary:  Negative for difficulty urinating.  ?Skin:  Negative for rash.  ?Allergic/Immunologic: Positive for environmental allergies.  ? ?Objective: ?BP 110/72   Pulse 78   Resp 16   SpO2 98%  ?There is no height or weight on file to calculate BMI. ?Physical Exam ?Vitals and nursing note reviewed.  ?Constitutional:   ?   Appearance: Normal appearance. She is well-developed.  ?HENT:  ?   Head: Normocephalic and atraumatic.  ?   Right Ear:  External ear normal.  ?   Left Ear: External ear normal.  ?   Ears:  ?   Comments: Fluid behind left TM - no erythema or bulging. ?   Nose: Congestion present.  ?   Mouth/Throat:  ?   Mouth: Mucous membranes are moist.  ?   Pharynx: Oropharynx is clear.  ?Eyes:  ?   Conjunctiva/sclera: Conjunctivae normal.  ?Cardiovascular:  ?   Rate and Rhythm: Normal rate and regular rhythm.  ?   Heart sounds: Normal heart sounds. No murmur heard. ?  No friction rub. No gallop.  ?Pulmonary:  ?   Effort: Pulmonary effort is normal.  ?   Breath sounds: Normal breath sounds. No wheezing, rhonchi or rales.  ?Abdominal:  ?   Palpations: Abdomen is soft.  ?Musculoskeletal:  ?   Cervical back: Neck supple.  ?Skin: ?   General: Skin is warm.  ?   Findings: No rash.  ?  Neurological:  ?   Mental Status: She is alert and oriented to person, place, and time.  ?Psychiatric:     ?   Behavior: Behavior normal.  ? ?Previous notes and tests were reviewed. ?The plan was reviewed with the patient/family, and all questions/concerned were addressed. ? ?It was my pleasure to see Rever today and participate in her care. Please feel free to contact me with any questions or concerns. ? ?Sincerely, ? ?Wyline Mood, DO ?Allergy & Immunology ? ?[facility] ?[place] office: [phone] ?[place] office: [phone] ?

## 2021-11-17 ENCOUNTER — Encounter: Payer: Self-pay | Admitting: Allergy

## 2021-11-17 ENCOUNTER — Ambulatory Visit (INDEPENDENT_AMBULATORY_CARE_PROVIDER_SITE_OTHER): Payer: No Typology Code available for payment source | Admitting: Allergy

## 2021-11-17 ENCOUNTER — Other Ambulatory Visit (HOSPITAL_COMMUNITY): Payer: Self-pay

## 2021-11-17 VITALS — BP 110/72 | HR 78 | Resp 16

## 2021-11-17 DIAGNOSIS — H1010 Acute atopic conjunctivitis, unspecified eye: Secondary | ICD-10-CM | POA: Diagnosis not present

## 2021-11-17 DIAGNOSIS — J329 Chronic sinusitis, unspecified: Secondary | ICD-10-CM

## 2021-11-17 DIAGNOSIS — Z880 Allergy status to penicillin: Secondary | ICD-10-CM

## 2021-11-17 DIAGNOSIS — J3089 Other allergic rhinitis: Secondary | ICD-10-CM

## 2021-11-17 DIAGNOSIS — J302 Other seasonal allergic rhinitis: Secondary | ICD-10-CM | POA: Diagnosis not present

## 2021-11-17 MED ORDER — LEVOCETIRIZINE DIHYDROCHLORIDE 5 MG PO TABS
5.0000 mg | ORAL_TABLET | Freq: Every evening | ORAL | 2 refills | Status: DC
Start: 2021-11-17 — End: 2021-11-17

## 2021-11-17 MED ORDER — MONTELUKAST SODIUM 10 MG PO TABS
10.0000 mg | ORAL_TABLET | Freq: Every day | ORAL | 2 refills | Status: DC
Start: 2021-11-17 — End: 2021-11-17

## 2021-11-17 MED ORDER — LEVOCETIRIZINE DIHYDROCHLORIDE 5 MG PO TABS
5.0000 mg | ORAL_TABLET | Freq: Every evening | ORAL | 2 refills | Status: DC
Start: 2021-11-17 — End: 2022-04-21
  Filled 2021-11-17 – 2021-12-04 (×3): qty 90, 90d supply, fill #0

## 2021-11-17 MED ORDER — MONTELUKAST SODIUM 10 MG PO TABS
10.0000 mg | ORAL_TABLET | Freq: Every day | ORAL | 2 refills | Status: DC
  Filled 2021-11-17: qty 90, 90d supply, fill #0

## 2021-11-17 NOTE — Assessment & Plan Note (Signed)
Past history - Perennial rhinitis symptoms for 10 years with worsening in the spring.  Tried Claritin and Zyrtec with some benefit.  Concerned about frequent sinusitis requiring antibiotics the last 4 months.  2022 skin testing showed: Borderline positive to ragweed, mold, dog. ?Interim history - did not like taste of Ryaltris. ?? Continue environmental control measures as below. ?? Take loratadine in the morning.  ?? Take Xyzal 5mg  at night.  ?? Continue Singulair (montelukast) 10mg  daily at night. ?? Nasal saline spray (i.e., Simply Saline) or nasal saline lavage (i.e., NeilMed) is recommended as needed and prior to medicated nasal sprays. ?? May use dymista (fluticasone + azelastine nasal spray combination) 1 spray per nostril twice a day as needed for nasal symptoms.  ?? Use olopatadine eye drops 0.2% once a day as needed for itchy/watery eyes. ?o Wait 10-15 minutes before putting contact lens in.  ?? Use refresh eye drops as needed.  ?

## 2021-11-17 NOTE — Assessment & Plan Note (Signed)
Past history - Frequent sinus infections the last 4 months. ENT evaluation unremarkable. ?Interim history -  Had strep throat.  ?? Keep track of infections/antibiotics use. ?

## 2021-11-17 NOTE — Patient Instructions (Addendum)
Environmental allergies ?2022 skin testing showed: Borderline positive to ragweed, mold, dog. ?Continue environmental control measures as below. ?Take loratadine in the morning.  ?Take Xyzal 5mg  at night.  ?Continue Singulair (montelukast) 10mg  daily at night. ?Nasal saline spray (i.e., Simply Saline) or nasal saline lavage (i.e., NeilMed) is recommended as needed and prior to medicated nasal sprays. ?May use dymista (fluticasone + azelastine nasal spray combination) 1 spray per nostril twice a day as needed for nasal symptoms.  ?Use olopatadine eye drops 0.2% once a day as needed ? ? for itchy/watery eyes. ?Wait 10-15 minutes before putting contact lens in.  ?Use refresh eye drops as needed.  ? ?Infections: ?Keep track of infections/antibiotics use. ? ?Penicillin allergy: ?Continue to avoid. ?Consider penicillin allergy skin testing and in office drug challenge in the future.  ?Over 90% of people with history of penicillin allergy which occurred over 10 years ago are found to be non-allergic.  ? ?You must be off antihistamines for 3-5 days before. Must be in good health and not ill. No vaccines/injections/antibiotics within the past 7 days. Plan on being in the office for 2-3 hours and must bring in the drug you want to do the oral challenge for - will send in prescription to pick up a few days before. You must call to schedule an appointment and specify it's for a drug challenge.  ? ?Follow up in 6 months or sooner if needed.  ? ?Reducing Pollen Exposure ?Pollen seasons: trees (spring), grass (summer) and ragweed/weeds (fall). ?Keep windows closed in your home and car to lower pollen exposure.  ?Install air conditioning in the bedroom and throughout the house if possible.  ?Avoid going out in dry windy days - especially early morning. ?Pollen counts are highest between 5 - 10 AM and on dry, hot and windy days.  ?Save outside activities for late afternoon or after a heavy rain, when pollen levels are lower.  ?Avoid  mowing of grass if you have grass pollen allergy. ?Be aware that pollen can also be transported indoors on people and pets.  ?Dry your clothes in an automatic dryer rather than hanging them outside where they might collect pollen.  ?Rinse hair and eyes before bedtime. ?Mold Control ?Mold and fungi can grow on a variety of surfaces provided certain temperature and moisture conditions exist.  ?Outdoor molds grow on plants, decaying vegetation and soil. The major outdoor mold, Alternaria and Cladosporium, are found in very high numbers during hot and dry conditions. Generally, a late summer - fall peak is seen for common outdoor fungal spores. Rain will temporarily lower outdoor mold spore count, but counts rise rapidly when the rainy period ends. ?The most important indoor molds are Aspergillus and Penicillium. Dark, humid and poorly ventilated basements are ideal sites for mold growth. The next most common sites of mold growth are the bathroom and the kitchen. ?Outdoor (Seasonal) Mold Control ?Use air conditioning and keep windows closed. ?Avoid exposure to decaying vegetation. ?Avoid leaf raking. ?Avoid grain handling. ?Consider wearing a face mask if working in moldy areas.  ?Indoor (Perennial) Mold Control  ?Maintain humidity below 50%. ?Get rid of mold growth on hard surfaces with water, detergent and, if necessary, 5% bleach (do not mix with other cleaners). Then dry the area completely. If mold covers an area more than 10 square feet, consider hiring an indoor environmental professional. ?For clothing, washing with soap and water is best. If moldy items cannot be cleaned and dried, throw them away. ?Remove sources e.g.  contaminated carpets. ?Repair and seal leaking roofs or pipes. Using dehumidifiers in damp basements may be helpful, but empty the water and clean units regularly to prevent mildew from forming. All rooms, especially basements, bathrooms and kitchens, require ventilation and cleaning to deter  mold and mildew growth. Avoid carpeting on concrete or damp floors, and storing items in damp areas. ?Pet Allergen Avoidance: ?Contrary to popular opinion, there are no ?hypoallergenic? breeds of dogs or cats. That is because people are not allergic to an animal?s hair, but to an allergen found in the animal's saliva, dander (dead skin flakes) or urine. Pet allergy symptoms typically occur within minutes. For some people, symptoms can build up and become most severe 8 to 12 hours after contact with the animal. People with severe allergies can experience reactions in public places if dander has been transported on the pet owners? clothing. ?Keeping an animal outdoors is only a partial solution, since homes with pets in the yard still have higher concentrations of animal allergens. ?Before getting a pet, ask your allergist to determine if you are allergic to animals. If your pet is already considered part of your family, try to minimize contact and keep the pet out of the bedroom and other rooms where you spend a great deal of time. ?As with dust mites, vacuum carpets often or replace carpet with a hardwood floor, tile or linoleum. ?High-efficiency particulate air (HEPA) cleaners can reduce allergen levels over time. ?While dander and saliva are the source of cat and dog allergens, urine is the source of allergens from rabbits, hamsters, mice and [place] pigs; so ask a non-allergic family member to clean the animal?s cage. ?If you have a pet allergy, talk to your allergist about the potential for allergy immunotherapy (allergy shots). This strategy can often provide long-term relief. ? ? ? ?

## 2021-11-17 NOTE — Assessment & Plan Note (Signed)
Past history - Broke out in rash as a child after taking penicillin type antibiotics. ?? Continue to avoid. ?? Consider penicillin allergy skin testing and in office drug challenge in the future.  ?? Over 90% of people with history of penicillin allergy which occurred over 10 years ago are found to be non-allergic.  ?

## 2021-11-21 ENCOUNTER — Other Ambulatory Visit (HOSPITAL_COMMUNITY): Payer: Self-pay

## 2021-12-05 ENCOUNTER — Other Ambulatory Visit (HOSPITAL_COMMUNITY): Payer: Self-pay

## 2021-12-06 ENCOUNTER — Other Ambulatory Visit (HOSPITAL_COMMUNITY): Payer: Self-pay

## 2021-12-12 ENCOUNTER — Other Ambulatory Visit (HOSPITAL_COMMUNITY): Payer: Self-pay

## 2021-12-30 ENCOUNTER — Other Ambulatory Visit (HOSPITAL_COMMUNITY): Payer: Self-pay

## 2021-12-30 MED ORDER — METRONIDAZOLE 500 MG PO TABS
500.0000 mg | ORAL_TABLET | Freq: Two times a day (BID) | ORAL | 0 refills | Status: AC
  Filled 2021-12-30: qty 14, 7d supply, fill #0

## 2022-01-02 ENCOUNTER — Other Ambulatory Visit (HOSPITAL_COMMUNITY): Payer: Self-pay

## 2022-01-10 ENCOUNTER — Other Ambulatory Visit (HOSPITAL_COMMUNITY): Payer: Self-pay

## 2022-01-11 ENCOUNTER — Other Ambulatory Visit (HOSPITAL_COMMUNITY): Payer: Self-pay

## 2022-01-16 ENCOUNTER — Other Ambulatory Visit (HOSPITAL_COMMUNITY): Payer: Self-pay

## 2022-01-17 ENCOUNTER — Other Ambulatory Visit (HOSPITAL_COMMUNITY): Payer: Self-pay

## 2022-01-17 MED ORDER — CITALOPRAM HYDROBROMIDE 20 MG PO TABS: 20.0000 mg | ORAL_TABLET | Freq: Every day | ORAL | 0 refills | Status: AC

## 2022-01-18 ENCOUNTER — Other Ambulatory Visit (HOSPITAL_COMMUNITY): Payer: Self-pay

## 2022-01-18 MED ORDER — CITALOPRAM HYDROBROMIDE 20 MG PO TABS
20.0000 mg | ORAL_TABLET | Freq: Every day | ORAL | 0 refills | Status: AC
  Filled 2022-01-18: qty 30, 30d supply, fill #0

## 2022-03-08 ENCOUNTER — Ambulatory Visit: Payer: 59 | Admitting: Internal Medicine

## 2022-04-21 ENCOUNTER — Telehealth: Payer: Self-pay | Admitting: Allergy

## 2022-04-21 ENCOUNTER — Other Ambulatory Visit: Payer: Self-pay | Admitting: *Deleted

## 2022-04-21 MED ORDER — MONTELUKAST SODIUM 10 MG PO TABS: 10.0000 mg | ORAL_TABLET | Freq: Every day | ORAL | 0 refills | Status: AC

## 2022-04-21 MED ORDER — LEVOCETIRIZINE DIHYDROCHLORIDE 5 MG PO TABS: 5.0000 mg | ORAL_TABLET | Freq: Every evening | ORAL | 0 refills | Status: AC

## 2022-04-21 NOTE — Telephone Encounter (Signed)
Patient is requesting a refill on Xyzal and Singulair sent to [facility] on [address] in [place].

## 2022-04-21 NOTE — Telephone Encounter (Signed)
One refill sent 

## 2022-05-09 ENCOUNTER — Telehealth: Payer: Self-pay

## 2022-05-09 MED ORDER — OLOPATADINE HCL 0.2 % OP SOLN: 1.0000 [drp] | Freq: Every day | OPHTHALMIC | 5 refills | Status: AC | PRN

## 2022-05-09 NOTE — Telephone Encounter (Signed)
Patient called needing her Olopatadine 0.2% solution sent to a different pharmacy: [facility] [address] [place]

## 2022-05-23 ENCOUNTER — Telehealth: Payer: Self-pay | Admitting: Allergy

## 2022-05-23 NOTE — Telephone Encounter (Signed)
Please call patient to confirm penicillin drug testing/challenge for Thursday.  Ask what pharmacy she wants [place] to send the medication to.  You must be off antihistamines for 3-5 days before. Must be in good health and not ill. No vaccines/injections/antibiotics within the past 7 days. Plan on being in the office for 2-3 hours and must bring in the drug you want to do the oral challenge for - will send in prescription to pick up a few days before.

## 2022-05-23 NOTE — Telephone Encounter (Signed)
I called patient. Patient said she had already cancelled her appointment since she has moved now.

## 2022-05-25 ENCOUNTER — Encounter: Payer: Self-pay | Admitting: Allergy
